# Patient Record
Sex: Female | Born: 1965 | Race: White | Hispanic: No | Marital: Married | Smoking: Never smoker
Health system: Southern US, Community
[De-identification: ages and names within clinical notes are randomized; demographics above are authoritative.]

## PROBLEM LIST (undated history)

## (undated) DIAGNOSIS — T4145XA Adverse effect of unspecified anesthetic, initial encounter: Secondary | ICD-10-CM

## (undated) DIAGNOSIS — M25569 Pain in unspecified knee: Secondary | ICD-10-CM

## (undated) DIAGNOSIS — R51 Headache: Secondary | ICD-10-CM

## (undated) DIAGNOSIS — Z8489 Family history of other specified conditions: Secondary | ICD-10-CM

## (undated) DIAGNOSIS — R519 Headache, unspecified: Secondary | ICD-10-CM

## (undated) DIAGNOSIS — I10 Essential (primary) hypertension: Secondary | ICD-10-CM

## (undated) DIAGNOSIS — Z9889 Other specified postprocedural states: Secondary | ICD-10-CM

## (undated) DIAGNOSIS — K219 Gastro-esophageal reflux disease without esophagitis: Secondary | ICD-10-CM

## (undated) DIAGNOSIS — R112 Nausea with vomiting, unspecified: Secondary | ICD-10-CM

## (undated) DIAGNOSIS — T8859XA Other complications of anesthesia, initial encounter: Secondary | ICD-10-CM

## (undated) HISTORY — PX: CHOLECYSTECTOMY: SHX55

## (undated) HISTORY — PX: MENISCUS REPAIR: SHX5179

## (undated) HISTORY — PX: APPENDECTOMY: SHX54

## (undated) HISTORY — PX: TENDON REPAIR: SHX5111

## (undated) HISTORY — PX: ABDOMINAL HYSTERECTOMY: SHX81

---

## 2011-01-19 ENCOUNTER — Ambulatory Visit: Payer: Self-pay | Admitting: Internal Medicine

## 2011-06-12 DIAGNOSIS — I10 Essential (primary) hypertension: Secondary | ICD-10-CM | POA: Insufficient documentation

## 2012-12-05 ENCOUNTER — Ambulatory Visit: Payer: Self-pay | Admitting: Physician Assistant

## 2013-04-07 ENCOUNTER — Ambulatory Visit: Payer: Self-pay | Admitting: Family Medicine

## 2013-12-01 ENCOUNTER — Ambulatory Visit: Payer: Self-pay | Admitting: Emergency Medicine

## 2015-02-05 ENCOUNTER — Ambulatory Visit: Payer: Self-pay | Admitting: Emergency Medicine

## 2015-02-25 ENCOUNTER — Ambulatory Visit
Admit: 2015-02-25 | Disposition: A | Discharge status: Home / Self Care | Payer: Self-pay | Attending: Family Medicine | Admitting: Family Medicine

## 2015-03-13 ENCOUNTER — Ambulatory Visit
Admit: 2015-03-13 | Disposition: A | Discharge status: Home / Self Care | Payer: Self-pay | Attending: Family Medicine | Admitting: Family Medicine

## 2015-04-25 DIAGNOSIS — K219 Gastro-esophageal reflux disease without esophagitis: Secondary | ICD-10-CM | POA: Insufficient documentation

## 2015-05-02 ENCOUNTER — Telehealth: Payer: Self-pay | Admitting: Gastroenterology

## 2015-05-02 NOTE — Telephone Encounter (Signed)
Colonoscopy triage °

## 2015-05-08 ENCOUNTER — Other Ambulatory Visit: Payer: Self-pay

## 2015-05-08 DIAGNOSIS — N200 Calculus of kidney: Secondary | ICD-10-CM | POA: Insufficient documentation

## 2015-05-08 NOTE — Telephone Encounter (Signed)
Pt is returning your phone call regarding colonoscopy triage

## 2015-05-08 NOTE — Telephone Encounter (Signed)
LVM for pt to return my call.

## 2015-05-08 NOTE — Telephone Encounter (Signed)
Gastroenterology Pre-Procedure Review  Request Date: 05-18-15 Requesting Physician: Dr. Rolm Gala  PATIENT REVIEW QUESTIONS: The patient responded to the following health history questions as indicated:    1. Are you having any GI issues? no 2. Do you have a personal history of Polyps? no 3. Do you have a family history of Colon Cancer or Polyps? no 4. Diabetes Mellitus? no 5. Joint replacements in the past 12 months?no 6. Major health problems in the past 3 months?no 7. Any artificial heart valves, MVP, or defibrillator?no    MEDICATIONS & ALLERGIES:    Patient reports the following regarding taking any anticoagulation/antiplatelet therapy:   Plavix, Coumadin, Eliquis, Xarelto, Lovenox, Pradaxa, Brilinta, or Effient? no Aspirin? yes (81mg )  Patient confirms/reports the following medications:  Current Outpatient Prescriptions  Medication Sig Dispense Refill  . ALLEGRA-D ALLERGY & CONGESTION 180-240 MG per 24 hr tablet TAKE 1 TAB BY MOUTH ONCE A DAY  0  . aspirin EC 81 MG tablet Take by mouth.    . butalbital-acetaminophen-caffeine (FIORICET, ESGIC) 50-325-40 MG per tablet TAKE 1 TABLET BY MOUTH EVERY 6 HOURS AS NEEDED FOR MIGRAINE  99  . FLUoxetine (PROZAC) 40 MG capsule Take 40 mg by mouth daily.  0  . fluticasone (FLONASE) 50 MCG/ACT nasal spray Place 2 sprays into both nostrils daily.  2  . lisinopril (PRINIVIL,ZESTRIL) 10 MG tablet TAKE 1 TABLET (10 MG TOTAL) BY MOUTH ONCE DAILY.  11  . LORazepam (ATIVAN) 0.5 MG tablet TAKE 1/2-1 TABLET BY MOUTH THREE TIMES A DAY AS NEEDED  0  . meloxicam (MOBIC) 15 MG tablet TK 1 T PO QD.  DO NOT TAKE WITH MOTRIN OR ALEVE  0  . omeprazole (PRILOSEC) 20 MG capsule Take 20 mg by mouth daily.  0  . oxyCODONE-acetaminophen (PERCOCET/ROXICET) 5-325 MG per tablet TK 1 T PO Q 8 H PRN  0  . topiramate (TOPAMAX) 100 MG tablet Take 100 mg by mouth daily.  0  . traMADol (ULTRAM) 50 MG tablet Take 50 mg by mouth every 6 (six) hours as needed. for pain  0    No current facility-administered medications for this visit.    Patient confirms/reports the following allergies:  Allergies  Allergen Reactions  . Other Rash and Anaphylaxis    ARUGULA Uncoded Allergy. Allergen: Shellfish Arugula: Swelling of the throat    No orders of the defined types were placed in this encounter.    AUTHORIZATION INFORMATION Primary Insurance: 1D#: Group #:  Secondary Insurance: 1D#: Group #:  SCHEDULE INFORMATION: Date: 05-18-15 Time: Location: [facility]

## 2015-05-09 ENCOUNTER — Other Ambulatory Visit: Payer: Self-pay | Admitting: Family Medicine

## 2015-05-09 DIAGNOSIS — Z1231 Encounter for screening mammogram for malignant neoplasm of breast: Secondary | ICD-10-CM

## 2015-05-14 ENCOUNTER — Encounter: Payer: Self-pay | Admitting: *Deleted

## 2015-05-15 ENCOUNTER — Ambulatory Visit
Admission: RE | Admit: 2015-05-15 | Discharge: 2015-05-15 | Disposition: A | Discharge status: Home / Self Care | Payer: BLUE CROSS/BLUE SHIELD | Source: Ambulatory Visit | Attending: Family Medicine | Admitting: Family Medicine

## 2015-05-15 ENCOUNTER — Other Ambulatory Visit: Payer: Self-pay

## 2015-05-15 DIAGNOSIS — Z1211 Encounter for screening for malignant neoplasm of colon: Secondary | ICD-10-CM

## 2015-05-15 DIAGNOSIS — Z1231 Encounter for screening mammogram for malignant neoplasm of breast: Secondary | ICD-10-CM

## 2015-05-15 MED ORDER — NA SULFATE-K SULFATE-MG SULF 17.5-3.13-1.6 GM/177ML PO SOLN: 1.0000 | ORAL | Status: DC

## 2015-05-17 NOTE — Discharge Instructions (Signed)

## 2015-05-18 ENCOUNTER — Ambulatory Visit: Payer: BLUE CROSS/BLUE SHIELD | Admitting: Anesthesiology

## 2015-05-18 ENCOUNTER — Ambulatory Visit
Admission: RE | Admit: 2015-05-18 | Discharge: 2015-05-18 | Disposition: A | Discharge status: Home / Self Care | Payer: BLUE CROSS/BLUE SHIELD | Source: Ambulatory Visit | Attending: Gastroenterology | Admitting: Gastroenterology

## 2015-05-18 ENCOUNTER — Encounter
Admission: RE | Disposition: A | Discharge status: Home / Self Care | Payer: Self-pay | Source: Ambulatory Visit | Attending: Gastroenterology

## 2015-05-18 DIAGNOSIS — Z1211 Encounter for screening for malignant neoplasm of colon: Secondary | ICD-10-CM | POA: Insufficient documentation

## 2015-05-18 DIAGNOSIS — K640 First degree hemorrhoids: Secondary | ICD-10-CM | POA: Diagnosis not present

## 2015-05-18 DIAGNOSIS — K5730 Diverticulosis of large intestine without perforation or abscess without bleeding: Secondary | ICD-10-CM | POA: Insufficient documentation

## 2015-05-18 DIAGNOSIS — K219 Gastro-esophageal reflux disease without esophagitis: Secondary | ICD-10-CM | POA: Insufficient documentation

## 2015-05-18 DIAGNOSIS — Z90710 Acquired absence of both cervix and uterus: Secondary | ICD-10-CM | POA: Diagnosis not present

## 2015-05-18 DIAGNOSIS — Z888 Allergy status to other drugs, medicaments and biological substances status: Secondary | ICD-10-CM | POA: Insufficient documentation

## 2015-05-18 DIAGNOSIS — Z9049 Acquired absence of other specified parts of digestive tract: Secondary | ICD-10-CM | POA: Diagnosis not present

## 2015-05-18 DIAGNOSIS — I10 Essential (primary) hypertension: Secondary | ICD-10-CM | POA: Diagnosis not present

## 2015-05-18 DIAGNOSIS — Z79899 Other long term (current) drug therapy: Secondary | ICD-10-CM | POA: Diagnosis not present

## 2015-05-18 HISTORY — DX: Other specified postprocedural states: Z98.890

## 2015-05-18 HISTORY — DX: Headache: R51

## 2015-05-18 HISTORY — DX: Other complications of anesthesia, initial encounter: T88.59XA

## 2015-05-18 HISTORY — DX: Family history of other specified conditions: Z84.89

## 2015-05-18 HISTORY — DX: Nausea with vomiting, unspecified: R11.2

## 2015-05-18 HISTORY — DX: Pain in unspecified knee: M25.569

## 2015-05-18 HISTORY — DX: Essential (primary) hypertension: I10

## 2015-05-18 HISTORY — DX: Headache, unspecified: R51.9

## 2015-05-18 HISTORY — DX: Adverse effect of unspecified anesthetic, initial encounter: T41.45XA

## 2015-05-18 HISTORY — PX: COLONOSCOPY WITH PROPOFOL: SHX5780

## 2015-05-18 HISTORY — DX: Gastro-esophageal reflux disease without esophagitis: K21.9

## 2015-05-18 SURGERY — COLONOSCOPY WITH PROPOFOL
Anesthesia: Monitor Anesthesia Care | Wound class: Contaminated

## 2015-05-18 MED ORDER — ACETAMINOPHEN 325 MG PO TABS: 325.0000 mg | ORAL_TABLET | ORAL | Status: DC | PRN

## 2015-05-18 MED ORDER — STERILE WATER FOR IRRIGATION IR SOLN
Status: DC | PRN
  Administered 2015-05-18: 13:00:00

## 2015-05-18 MED ORDER — LIDOCAINE HCL (CARDIAC) 20 MG/ML IV SOLN
INTRAVENOUS | Status: DC | PRN
  Administered 2015-05-18: 40 mg via INTRAVENOUS

## 2015-05-18 MED ORDER — ONDANSETRON HCL 4 MG/2ML IJ SOLN
INTRAMUSCULAR | Status: DC | PRN
Start: 2015-05-18 — End: 2015-05-18
  Administered 2015-05-18: 4 mg via INTRAVENOUS

## 2015-05-18 MED ORDER — PROPOFOL 10 MG/ML IV BOLUS
INTRAVENOUS | Status: DC | PRN
  Administered 2015-05-18: 100 mg via INTRAVENOUS
  Administered 2015-05-18 (×3): 50 mg via INTRAVENOUS
  Administered 2015-05-18: 100 mg via INTRAVENOUS

## 2015-05-18 MED ORDER — ACETAMINOPHEN 160 MG/5ML PO SOLN: 325.0000 mg | ORAL | Status: DC | PRN

## 2015-05-18 MED ORDER — SODIUM CHLORIDE 0.9 % IV SOLN: INTRAVENOUS | Status: DC

## 2015-05-18 MED ORDER — LACTATED RINGERS IV SOLN
INTRAVENOUS | Status: DC
  Administered 2015-05-18: 13:00:00 via INTRAVENOUS

## 2015-05-18 SURGICAL SUPPLY — 28 items

## 2015-05-18 NOTE — H&P (Signed)
[facility]  [address], [place], [place] [address] Phone: [phone] Fax : [phone]  Primary Care Physician:  Hortencia Pilar, MD Primary Gastroenterologist:  Dr. Allen Norris  Pre-Procedure History & Physical: HPI:  Brianna Mckenzie is a 49 y.o. female is here for a screening colonoscopy.   Past Medical History  Diagnosis Date  . Complication of anesthesia     causes migraines and PONV  . PONV (postoperative nausea and vomiting)   . Family history of adverse reaction to anesthesia     causes migraines and PONV  . Hypertension   . Headache     migraines  . GERD (gastroesophageal reflux disease)   . Knee pain     left    Past Surgical History  Procedure Laterality Date  . Cholecystectomy    . Appendectomy    . Abdominal hysterectomy    . Tendon repair Left     thumb  . Meniscus repair Right     Prior to Admission medications   Medication Sig Start Date End Date Taking? Authorizing Provider  aspirin EC 81 MG tablet Take by mouth.   Yes Historical Provider, MD  B Complex Vitamins (VITAMIN B COMPLEX PO) Take by mouth.   Yes Historical Provider, MD  EPINEPHrine (EPIPEN 2-PAK) 0.3 mg/0.3 mL IJ SOAJ injection Inject into the muscle once.   Yes Historical Provider, MD  FLUoxetine (PROZAC) 40 MG capsule Take 40 mg by mouth daily. PM 03/13/15  Yes Historical Provider, MD  ibuprofen (ADVIL,MOTRIN) 800 MG tablet Take 800 mg by mouth every 8 (eight) hours as needed.   Yes Historical Provider, MD  lisinopril (PRINIVIL,ZESTRIL) 10 MG tablet TAKE 1 TABLET (10 MG TOTAL) BY MOUTH ONCE DAILY. 04/26/15  Yes Historical Provider, MD  MELATONIN PO Take by mouth. PM   Yes Historical Provider, MD  Na Sulfate-K Sulfate-Mg Sulf (SUPREP BOWEL PREP) SOLN Take 1 kit by mouth as directed. 05/15/15  Yes Lucilla Lame, MD  omeprazole (PRILOSEC) 20 MG capsule Take 20 mg by mouth daily. Taking every 3rd evening 03/13/15  Yes Historical Provider, MD  ondansetron (ZOFRAN) 4 MG tablet Take  4 mg by mouth every 8 (eight) hours as needed for nausea or vomiting.   Yes Historical Provider, MD  ranitidine (ZANTAC) 150 MG tablet Take 150 mg by mouth 2 (two) times daily. On days not taking omeprazole   Yes Historical Provider, MD  topiramate (TOPAMAX) 100 MG tablet Take 100 mg by mouth daily. PM 03/13/15  Yes Historical Provider, MD  ALLEGRA-D ALLERGY & CONGESTION 180-240 MG per 24 hr tablet TAKE 1 TAB BY MOUTH ONCE A DAY 02/25/15   Historical Provider, MD  butalbital-acetaminophen-caffeine (FIORICET, ESGIC) 50-325-40 MG per tablet TAKE 1 TABLET BY MOUTH EVERY 6 HOURS AS NEEDED FOR MIGRAINE 04/25/15   Historical Provider, MD  fluticasone (FLONASE) 50 MCG/ACT nasal spray Place 2 sprays into both nostrils daily. 02/05/15   Historical Provider, MD  LORazepam (ATIVAN) 0.5 MG tablet TAKE 1/2-1 TABLET BY MOUTH THREE TIMES A DAY AS NEEDED 04/25/15   Historical Provider, MD  meloxicam (MOBIC) 15 MG tablet TK 1 T PO QD.  DO NOT TAKE WITH MOTRIN OR ALEVE 03/13/15   Historical Provider, MD  oxyCODONE-acetaminophen (PERCOCET/ROXICET) 5-325 MG per tablet TK 1 T PO Q 8 H PRN 03/13/15   Historical Provider, MD  traMADol (ULTRAM) 50 MG tablet Take 50 mg by mouth every 6 (six) hours as needed. for pain 03/16/15   Historical Provider, MD  Allergies as of 05/08/2015 - Review Complete 05/08/2015  Allergen Reaction Noted  . Other Rash and Anaphylaxis 05/08/2015    History reviewed. No pertinent family history.  History   Social History  . Marital Status: Married    Spouse Name: N/A  . Number of Children: N/A  . Years of Education: N/A   Occupational History  . Not on file.   Social History Main Topics  . Smoking status: Never Smoker   . Smokeless tobacco: Not on file  . Alcohol Use: No  . Drug Use: Not on file  . Sexual Activity: Not on file   Other Topics Concern  . Not on file   Social History Narrative    Review of Systems: See HPI, otherwise negative ROS  Physical Exam: BP 127/93 mmHg   Pulse 110  Resp 18  Ht 5' (1.524 m)  Wt 211 lb (95.709 kg)  BMI 41.21 kg/m2  SpO2 95% General:   Alert,  pleasant and cooperative in NAD Head:  Normocephalic and atraumatic. Neck:  Supple; no masses or thyromegaly. Lungs:  Clear throughout to auscultation.    Heart:  Regular rate and rhythm. Abdomen:  Soft, nontender and nondistended. Normal bowel sounds, without guarding, and without rebound.   Neurologic:  Alert and  oriented x4;  grossly normal neurologically.  Impression/Plan: Brianna Mckenzie is now here to undergo a screening colonoscopy.  Risks, benefits, and alternatives regarding colonoscopy have been reviewed with the patient.  Questions have been answered.  All parties agreeable.

## 2015-05-18 NOTE — Anesthesia Procedure Notes (Addendum)
Procedure Name: MAC Performed by: Lilyann Gravelle Pre-anesthesia Checklist: Patient identified, Emergency Drugs available, Suction available, Timeout performed and Patient being monitored Patient Re-evaluated:Patient Re-evaluated prior to inductionOxygen Delivery Method: Nasal cannula Placement Confirmation: positive ETCO2     

## 2015-05-18 NOTE — Anesthesia Preprocedure Evaluation (Signed)
Anesthesia Evaluation  Patient identified by MRN, date of birth, ID band  Reviewed: Allergy & Precautions, H&P , NPO status , Patient's Chart, lab work & pertinent test results  Airway Mallampati: II  TM Distance: >3 FB Neck ROM: full    Dental no notable dental hx.    Pulmonary    Pulmonary exam normal       Cardiovascular hypertension, Rhythm:regular Rate:Normal     Neuro/Psych  Headaches,    GI/Hepatic   Endo/Other    Renal/GU      Musculoskeletal   Abdominal   Peds  Hematology   Anesthesia Other Findings   Reproductive/Obstetrics                             Anesthesia Physical Anesthesia Plan  ASA: II  Anesthesia Plan: MAC   Post-op Pain Management:    Induction:   Airway Management Planned:   Additional Equipment:   Intra-op Plan:   Post-operative Plan:   Informed Consent: I have reviewed the patients History and Physical, chart, labs and discussed the procedure including the risks, benefits and alternatives for the proposed anesthesia with the patient or authorized representative who has indicated his/her understanding and acceptance.     Plan Discussed with: CRNA  Anesthesia Plan Comments:         Anesthesia Quick Evaluation

## 2015-05-18 NOTE — Transfer of Care (Signed)
Immediate Anesthesia Transfer of Care Note  Patient: Brianna Mckenzie  Procedure(s) Performed: Procedure(s): COLONOSCOPY WITH PROPOFOL (N/A)  Patient Location: PACU  Anesthesia Type: MAC  Level of Consciousness: awake, alert  and patient cooperative  Airway and Oxygen Therapy: Patient Spontanous Breathing and Patient connected to supplemental oxygen  Post-op Assessment: Post-op Vital signs reviewed, Patient's Cardiovascular Status Stable, Respiratory Function Stable, Patent Airway and No signs of Nausea or vomiting  Post-op Vital Signs: Reviewed and stable  Complications: No apparent anesthesia complications

## 2015-05-18 NOTE — Op Note (Signed)
[facility] Gastroenterology Patient Name: Brianna Mckenzie Procedure Date: 05/18/2015 12:55 PM MRN: [id number] Account #: [id number] Date of Birth: 02/14/1966 Admit Type: Outpatient Age: 49 [facility] Gender: Female Note Status: Finalized Procedure:         Colonoscopy Indications:       Screening for colorectal malignant neoplasm Providers:         Midge Miniumarren Jailine Lieder, MD Referring MD:      Letitia CaulHeidi M. Grandis, MD (Referring MD) Medicines:         Propofol per Anesthesia Complications:     No immediate complications. Procedure:         Pre-Anesthesia Assessment:                    - Prior to the procedure, a History and Physical was                     performed, and patient medications and allergies were                     reviewed. The patient's tolerance of previous anesthesia                     was also reviewed. The risks and benefits of the procedure                     and the sedation options and risks were discussed with the                     patient. All questions were answered, and informed consent                     was obtained. Prior Anticoagulants: The patient has taken                     no previous anticoagulant or antiplatelet agents. ASA                     Grade Assessment: II - A patient with mild systemic                     disease. After reviewing the risks and benefits, the                     patient was deemed in satisfactory condition to undergo                     the procedure.                    After obtaining informed consent, the colonoscope was                     passed under direct vision. Throughout the procedure, the                     patient's blood pressure, pulse, and oxygen saturations                     were monitored continuously. The Olympus CF H180AL                     colonoscope (S#: [id number]) was introduced through the anus  and advanced to the the cecum, identified by appendiceal                 orifice and ileocecal valve. The colonoscopy was performed                     without difficulty. The patient tolerated the procedure                     well. The quality of the bowel preparation was excellent. Findings:      The perianal and digital rectal examinations were normal.      A few small-mouthed diverticula were found in the sigmoid colon.      Non-bleeding internal hemorrhoids were found during retroflexion. The       hemorrhoids were Grade I (internal hemorrhoids that do not prolapse). Impression:        - Diverticulosis in the sigmoid colon.                    - Non-bleeding internal hemorrhoids.                    - No specimens collected. Recommendation:    - Repeat colonoscopy in 10 years for screening unless any                     change in family history or lower GI problems. Procedure Code(s): --- Professional ---                    [phone], Colonoscopy, flexible; diagnostic, including                     collection of specimen(s) by brushing or washing, when                     performed (separate procedure) Diagnosis Code(s): --- Professional ---                    Z12.11, Encounter for screening for malignant neoplasm of                     colon                    K64.0, First degree hemorrhoids CPT copyright 2014 American Medical Association. All rights reserved. The codes documented in this report are preliminary and upon coder review may  be revised to meet current compliance requirements. Midge Minium, MD 05/18/2015 1:07:17 PM This report has been signed electronically. Number of Addenda: 0 Note Initiated On: 05/18/2015 12:55 PM Scope Withdrawal Time: 0 hours 6 minutes 5 seconds  Total Procedure Duration: 0 hours 8 minutes 42 seconds       [facility]

## 2015-05-18 NOTE — Anesthesia Postprocedure Evaluation (Signed)
  Anesthesia Post-op Note  Patient: Brianna Mckenzie  Procedure(s) Performed: Procedure(s): COLONOSCOPY WITH PROPOFOL (N/A)  Anesthesia type:MAC  Patient location: PACU  Post pain: Pain level controlled  Post assessment: Post-op Vital signs reviewed, Patient's Cardiovascular Status Stable, Respiratory Function Stable, Patent Airway and No signs of Nausea or vomiting  Post vital signs: Reviewed and stable  Last Vitals:  Filed Vitals:   05/18/15 1318  BP: 84/55  Pulse: 92  Temp:   Resp: 26    Level of consciousness: awake, alert  and patient cooperative  Complications: No apparent anesthesia complications

## 2015-05-22 ENCOUNTER — Encounter: Payer: Self-pay | Admitting: Gastroenterology

## 2015-05-23 DIAGNOSIS — K640 First degree hemorrhoids: Secondary | ICD-10-CM | POA: Insufficient documentation

## 2015-05-23 DIAGNOSIS — Z1211 Encounter for screening for malignant neoplasm of colon: Secondary | ICD-10-CM | POA: Insufficient documentation

## 2016-01-23 ENCOUNTER — Other Ambulatory Visit: Payer: Self-pay | Admitting: Family Medicine

## 2016-01-23 ENCOUNTER — Ambulatory Visit
Admission: RE | Admit: 2016-01-23 | Discharge: 2016-01-23 | Disposition: A | Discharge status: Home / Self Care | Payer: BLUE CROSS/BLUE SHIELD | Source: Ambulatory Visit | Attending: Family Medicine | Admitting: Family Medicine

## 2016-01-23 DIAGNOSIS — R109 Unspecified abdominal pain: Secondary | ICD-10-CM | POA: Diagnosis present

## 2016-01-23 DIAGNOSIS — N200 Calculus of kidney: Secondary | ICD-10-CM | POA: Diagnosis not present

## 2016-01-23 DIAGNOSIS — R319 Hematuria, unspecified: Secondary | ICD-10-CM

## 2016-01-23 MED ORDER — IOHEXOL 350 MG/ML SOLN
100.0000 mL | Freq: Once | INTRAVENOUS | Status: AC | PRN
  Administered 2016-01-23: 100 mL via INTRAVENOUS

## 2016-05-24 ENCOUNTER — Ambulatory Visit (INDEPENDENT_AMBULATORY_CARE_PROVIDER_SITE_OTHER): Payer: BLUE CROSS/BLUE SHIELD | Admitting: Internal Medicine

## 2016-05-24 ENCOUNTER — Ambulatory Visit (INDEPENDENT_AMBULATORY_CARE_PROVIDER_SITE_OTHER): Payer: BLUE CROSS/BLUE SHIELD

## 2016-05-24 VITALS — BP 130/90 | HR 95 | Temp 98.4°F | Resp 18 | Ht 60.0 in | Wt 212.0 lb

## 2016-05-24 DIAGNOSIS — S0033XA Contusion of nose, initial encounter: Secondary | ICD-10-CM

## 2016-05-24 NOTE — Progress Notes (Signed)
Subjective:  By signing my name below, I, Brianna Mckenzie, attest that this documentation has been prepared under the direction and in the presence of Tami Lin, MD. Electronically Signed: Moises Mckenzie, Leando. 05/24/2016 , 11:51 AM .  Patient was seen in Room 14 .   Patient ID: Brianna Mckenzie, female    DOB: 01/21/1966, 50 y.o.   MRN: [id number] No chief complaint on file.  HPI Brianna Mckenzie is a 50 y.o. female who presents to [facility] complaining of facial injury due to a fall which occurred early this morning. Her niece is visiting her; they decided to enjoy a few glasses of wine and watched "The Conjuring" (a horror movie). Her niece did not want to sleep by herself in the upstairs guest room, so they decided to use a blow-up mattress placed in the master bedroom. At around 4:00AM, the patient woke up to go to the bathroom. She tripped over the blow-up mattress and/or her Restaurant manager, fast food. She fell and hit her face onto the dresser, also reporting a lot of Mckenzie from her nose. She may have had syncope due to shock. She notes upper lip feeling swollen, and facial ache in her cheeks with a headache. She denies neck pain or neck stiffness.   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   Patient Active Problem List   Diagnosis Date Noted  . Special screening for malignant neoplasms, colon   . First degree hemorrhoids   . Calculus of kidney 05/08/2015  . Gastro-esophageal reflux disease without esophagitis 04/25/2015  . Essential (primary) hypertension 06/12/2011    Current outpatient prescriptions:  .  ALLEGRA-D ALLERGY & CONGESTION 180-240 MG per 24 hr tablet, TAKE 1 TAB BY MOUTH ONCE A DAY, Disp: , Rfl: 0 .  aspirin EC 81 MG tablet, Take by mouth., Disp: , Rfl:  .  B Complex Vitamins (VITAMIN B COMPLEX PO), Take by mouth., Disp: , Rfl:  .  butalbital-acetaminophen-caffeine (FIORICET, ESGIC) 50-325-40 MG per tablet, TAKE 1 TABLET BY MOUTH EVERY 6 HOURS AS NEEDED FOR MIGRAINE, Disp: ,  Rfl: 99 .  EPINEPHrine (EPIPEN 2-PAK) 0.3 mg/0.3 mL IJ SOAJ injection, Inject into the muscle once., Disp: , Rfl:  .  FLUoxetine (PROZAC) 40 MG capsule, Take 40 mg by mouth daily. PM, Disp: , Rfl: 0 .  lisinopril (PRINIVIL,ZESTRIL) 10 MG tablet, TAKE 1 TABLET (10 MG TOTAL) BY MOUTH ONCE DAILY., Disp: , Rfl: 11 .  MELATONIN PO, Take by mouth. PM, Disp: , Rfl:  .  Na Sulfate-K Sulfate-Mg Sulf (SUPREP BOWEL PREP) SOLN, Take 1 kit by mouth as directed., Disp: 1 Bottle, Rfl: 0 .  omeprazole (PRILOSEC) 20 MG capsule, Take 20 mg by mouth daily. Taking every 3rd evening, Disp: , Rfl: 0 .  ondansetron (ZOFRAN) 4 MG tablet, Take 4 mg by mouth every 8 (eight) hours as needed for nausea or vomiting., Disp: , Rfl:  .  ranitidine (ZANTAC) 150 MG tablet, Take 150 mg by mouth 2 (two) times daily. On days not taking omeprazole, Disp: , Rfl:  .  topiramate (TOPAMAX) 100 MG tablet, Take 100 mg by mouth daily. PM, Disp: , Rfl: 0 .  LORazepam (ATIVAN) 0.5 MG tablet, Reported on 05/24/2016, Disp: , Rfl: 0 .  meloxicam (MOBIC) 15 MG tablet, Reported on 05/24/2016, Disp: , Rfl: 0 .  oxyCODONE-acetaminophen (PERCOCET/ROXICET) 5-325 MG per tablet, Reported on 05/24/2016, Disp: , Rfl: 0 .  traMADol (ULTRAM) 50 MG tablet, Take 50 mg by mouth every 6 (six)  hours as needed. Reported on 05/24/2016, Disp: , Rfl: 0 Allergies  Allergen Reactions  . Other Rash and Anaphylaxis    ARUGULA Uncoded Allergy. Allergen: Shellfish Arugula: Swelling of the throat  . Chloraprep One Step [Chlorhexidine Gluconate] Rash  . Shrimp [Shellfish Allergy] Rash   Review of Systems  Constitutional: Negative for fever, chills and fatigue.  HENT: Positive for facial swelling and nosebleeds.   Respiratory: Negative for cough, shortness of breath and wheezing.   Gastrointestinal: Negative for nausea, vomiting and diarrhea.  Neurological: Positive for headaches. Negative for dizziness.  HA is periorbital No vision changes    Objective:   Physical Exam   Constitutional: She is oriented to person, place, and time. She appears well-developed and well-nourished. No distress.  HENT:  Head: Normocephalic and atraumatic.  Mouth/Throat: Oropharynx is clear and moist.  Nose: Mckenzie in both nares, nose itself is swollen with slight deviation to the left, no obvious septal hematoma, Mouth: upper lip is swollen with area of ecchymosis 2cm on the buccal mucosa, teeth are intact. No oral laceration Jaw opens fully without pain Tender both maxillary areas but no orbital rim tenderness No longer bleeding  Eyes: Conjunctivae and EOM are normal. Pupils are equal, round, and reactive to light.  Neck: Normal range of motion. Neck supple.  Cardiovascular: Normal rate.   Pulmonary/Chest: Effort normal. No respiratory distress.  Musculoskeletal: Normal range of motion.  Neurological: She is alert and oriented to person, place, and time. No cranial nerve deficit. Coordination normal.  Gait wnl  Skin: Skin is warm and dry.  Psychiatric: She has a normal mood and affect. Her behavior is normal.  Nursing note and vitals reviewed.   BP 130/90 mmHg  Pulse 95  Temp(Src) 98.4 F (36.9 C) (Oral)  Resp 18  Ht 5' (1.524 m)  Wt 212 lb (96.163 kg)  BMI 41.40 kg/m2  SpO2 98%   Dg Nasal Bones  05/24/2016  CLINICAL DATA:  Fall with nasal injury 1 night prior. EXAM: NASAL BONES - 3+ VIEW COMPARISON:  None. FINDINGS: There is no evidence of fracture or other bone abnormality. Nasal septum appears midline and intact. IMPRESSION: Negative. Electronically Signed   By: Ilona Sorrel M.D.   On: 05/24/2016 12:11  no sinus opacification    Assessment & Plan:  I have completed the patient encounter in its entirety as documented by the scribe, with editing by me where necessary. Robert P. Laney Pastor, M.D.  Contusion, nose/upper lip area, initial encounter -  Ice 20 q2h today Tylenol qid If pain increases will get CT to r/o occult fx or septal hematoma Might have concussion  sxt and these were discussed with re to F/U

## 2016-05-24 NOTE — Patient Instructions (Signed)
     IF you received an x-ray today, you will receive an invoice from [facility]. Please contact [facility] at [phone] with questions or concerns regarding your invoice.   IF you received labwork today, you will receive an invoice from [facility]. Please contact [facility] at [phone] with questions or concerns regarding your invoice.   Our billing staff will not be able to assist you with questions regarding bills from these companies.  You will be contacted with the lab results as soon as they are available. The fastest way to get your results is to activate your My Chart account. Instructions are located on the last page of this paperwork. If you have not heard from us regarding the results in 2 weeks, please contact this office.      

## 2017-09-21 ENCOUNTER — Other Ambulatory Visit: Payer: Self-pay | Admitting: Family Medicine

## 2017-09-21 DIAGNOSIS — Z1231 Encounter for screening mammogram for malignant neoplasm of breast: Secondary | ICD-10-CM

## 2017-09-30 ENCOUNTER — Ambulatory Visit: Payer: BLUE CROSS/BLUE SHIELD

## 2017-10-15 ENCOUNTER — Ambulatory Visit
Admission: RE | Admit: 2017-10-15 | Discharge: 2017-10-15 | Disposition: A | Discharge status: Home / Self Care | Payer: BLUE CROSS/BLUE SHIELD | Source: Ambulatory Visit | Attending: Family Medicine | Admitting: Family Medicine

## 2017-10-15 DIAGNOSIS — Z1231 Encounter for screening mammogram for malignant neoplasm of breast: Secondary | ICD-10-CM | POA: Diagnosis not present

## 2018-12-13 ENCOUNTER — Other Ambulatory Visit: Payer: Self-pay | Admitting: Family Medicine

## 2018-12-13 DIAGNOSIS — Z1231 Encounter for screening mammogram for malignant neoplasm of breast: Secondary | ICD-10-CM

## 2018-12-28 ENCOUNTER — Encounter (INDEPENDENT_AMBULATORY_CARE_PROVIDER_SITE_OTHER): Payer: Self-pay

## 2018-12-28 ENCOUNTER — Ambulatory Visit
Admission: RE | Admit: 2018-12-28 | Discharge: 2018-12-28 | Disposition: A | Discharge status: Home / Self Care | Payer: BLUE CROSS/BLUE SHIELD | Source: Ambulatory Visit | Attending: Family Medicine | Admitting: Family Medicine

## 2018-12-28 DIAGNOSIS — Z1231 Encounter for screening mammogram for malignant neoplasm of breast: Secondary | ICD-10-CM | POA: Diagnosis not present

## 2020-01-03 ENCOUNTER — Other Ambulatory Visit: Payer: Self-pay | Admitting: Family Medicine

## 2020-01-03 DIAGNOSIS — Z1231 Encounter for screening mammogram for malignant neoplasm of breast: Secondary | ICD-10-CM

## 2020-01-11 ENCOUNTER — Ambulatory Visit: Payer: BLUE CROSS/BLUE SHIELD

## 2020-03-12 ENCOUNTER — Encounter (INDEPENDENT_AMBULATORY_CARE_PROVIDER_SITE_OTHER): Payer: Self-pay

## 2020-03-12 ENCOUNTER — Other Ambulatory Visit: Payer: Self-pay

## 2020-03-12 ENCOUNTER — Ambulatory Visit
Admission: RE | Admit: 2020-03-12 | Discharge: 2020-03-12 | Disposition: A | Discharge status: Home / Self Care | Payer: BC Managed Care – PPO | Source: Ambulatory Visit | Attending: Family Medicine | Admitting: Family Medicine

## 2020-03-12 DIAGNOSIS — Z1231 Encounter for screening mammogram for malignant neoplasm of breast: Secondary | ICD-10-CM | POA: Diagnosis present

## 2021-02-12 ENCOUNTER — Ambulatory Visit: Payer: BLUE CROSS/BLUE SHIELD

## 2021-02-12 NOTE — Progress Notes (Unsigned)
Pt did not show for scheduled appointment. Office staff will reach out to reschedule.  

## 2021-02-13 ENCOUNTER — Ambulatory Visit (INDEPENDENT_AMBULATORY_CARE_PROVIDER_SITE_OTHER): Payer: BC Managed Care – PPO | Admitting: Internal Medicine

## 2021-02-13 DIAGNOSIS — I10 Essential (primary) hypertension: Secondary | ICD-10-CM

## 2021-02-13 DIAGNOSIS — G4733 Obstructive sleep apnea (adult) (pediatric): Secondary | ICD-10-CM | POA: Diagnosis not present

## 2021-02-13 DIAGNOSIS — K219 Gastro-esophageal reflux disease without esophagitis: Secondary | ICD-10-CM | POA: Diagnosis not present

## 2021-02-13 DIAGNOSIS — F320 Major depressive disorder, single episode, mild: Secondary | ICD-10-CM | POA: Diagnosis not present

## 2021-02-13 NOTE — Progress Notes (Signed)
Sleep Medicine   Office Visit  Patient Name: Brianna Mckenzie DOB: [birth year] MRN [id number]  I connected with  Brianna Mckenzie on 02/13/21 by a video enabled telemedicine application and verified that I am speaking with the correct person using two identifiers.   I discussed the limitations of evaluation and management by telemedicine. The patient expressed understanding and agreed to proceed.   Chief Complaint: SS results   HISTORY OF PRESENT ILLNESS: Brianna Mckenzie is seen today for follow up for CPAP titration results. She is recommended to start on APAP range 10-20 cm of H2O due to worsening apnea in certain positions. She does not take naps during the week because she is busy, but would if she had time. ESS 8 out of 24. Pt does have CV risk factor of hypertension. No significant PLMs during study.  ROS  General: (-) fever, (-) chills, (-) night sweat Nose and Sinuses: (-) nasal stuffiness or itchiness, (-) postnasal drip, (-) nosebleeds, (-) sinus trouble. Mouth and Throat: (-) sore throat, (-) hoarseness. Neck: (-) swollen glands, (-) enlarged thyroid, (-) neck pain. Respiratory: - cough, - shortness of breath, - wheezing. Neurologic: - numbness, - tingling. Psychiatric: - anxiety, - depression Sleep behavior: -sleep paralysis -hypnogogic hallucinations -dream enactment      -vivid dreams -cataplexy -night terrors -sleep walking   Current Medication: Outpatient Encounter Medications as of 02/13/2021  Medication Sig Note  . ALLEGRA-D ALLERGY & CONGESTION 180-240 MG per 24 hr tablet TAKE 1 TAB BY MOUTH ONCE A DAY 05/08/2015: Received from: External Pharmacy  . aspirin EC 81 MG tablet Take by mouth. 05/08/2015: Received from: [facility]  . B Complex Vitamins (VITAMIN B COMPLEX PO) Take by mouth.   . butalbital-acetaminophen-caffeine (FIORICET, ESGIC) 50-325-40 MG per tablet TAKE 1 TABLET BY MOUTH EVERY 6 HOURS AS NEEDED FOR MIGRAINE 05/08/2015: Received from:  External Pharmacy  . Cetirizine HCl 10 MG CAPS Take by mouth.   . EPINEPHrine (EPIPEN 2-PAK) 0.3 mg/0.3 mL IJ SOAJ injection Inject into the muscle once.   Marland Kitchen FLUoxetine (PROZAC) 20 MG tablet Take by mouth.   Marland Kitchen FLUoxetine (PROZAC) 40 MG capsule Take 40 mg by mouth daily. PM 05/08/2015: Received from: External Pharmacy  . lisinopril (ZESTRIL) 40 MG tablet Take 40 mg by mouth daily.   . ondansetron (ZOFRAN) 4 MG tablet Take 4 mg by mouth every 8 (eight) hours as needed for nausea or vomiting.   . traZODone (DESYREL) 50 MG tablet Take 50 mg by mouth at bedtime.   . [DISCONTINUED] lisinopril (PRINIVIL,ZESTRIL) 10 MG tablet TAKE 1 TABLET (10 MG TOTAL) BY MOUTH ONCE DAILY. 05/08/2015: Received from: External Pharmacy  . [DISCONTINUED] LORazepam (ATIVAN) 0.5 MG tablet Reported on 05/24/2016 05/08/2015: Received from: External Pharmacy  . [DISCONTINUED] MELATONIN PO Take by mouth. PM   . [DISCONTINUED] meloxicam (MOBIC) 15 MG tablet Reported on 05/24/2016 05/08/2015: Received from: External Pharmacy  . [DISCONTINUED] Na Sulfate-K Sulfate-Mg Sulf (SUPREP BOWEL PREP) SOLN Take 1 kit by mouth as directed.   . [DISCONTINUED] omeprazole (PRILOSEC) 20 MG capsule Take 20 mg by mouth daily. Taking every 3rd evening 05/08/2015: Received from: External Pharmacy  . [DISCONTINUED] oxyCODONE-acetaminophen (PERCOCET/ROXICET) 5-325 MG per tablet Reported on 05/24/2016 05/08/2015: Received from: External Pharmacy  . [DISCONTINUED] ranitidine (ZANTAC) 150 MG tablet Take 150 mg by mouth 2 (two) times daily. On days not taking omeprazole   . [DISCONTINUED] topiramate (TOPAMAX) 100 MG tablet Take 100 mg by mouth daily. PM 05/08/2015: Received from: External Pharmacy  . [  DISCONTINUED] traMADol (ULTRAM) 50 MG tablet Take 50 mg by mouth every 6 (six) hours as needed. Reported on 05/24/2016 05/08/2015: Received from: External Pharmacy   No facility-administered encounter medications on file as of 02/13/2021.    Surgical History: Past Surgical  History:  Procedure Laterality Date  . ABDOMINAL HYSTERECTOMY    . APPENDECTOMY    . CHOLECYSTECTOMY    . COLONOSCOPY WITH PROPOFOL N/A 05/18/2015   Procedure: COLONOSCOPY WITH PROPOFOL;  Surgeon: Midge Minium, MD;  Location: [facility];  Service: Endoscopy;  Laterality: N/A;  . MENISCUS REPAIR Right   . TENDON REPAIR Left    thumb    Medical History: Past Medical History:  Diagnosis Date  . Complication of anesthesia    causes migraines and PONV  . Family history of adverse reaction to anesthesia    causes migraines and PONV  . GERD (gastroesophageal reflux disease)   . Headache    migraines  . Hypertension   . Knee pain    left  . PONV (postoperative nausea and vomiting)     Family History: Non contributory to the present illness  Social History: Social History   Socioeconomic History  . Marital status: Married    Spouse name: Not on file  . Number of children: Not on file  . Years of education: Not on file  . Highest education level: Not on file  Occupational History  . Not on file  Tobacco Use  . Smoking status: Never Smoker  . Smokeless tobacco: Never Used  Substance and Sexual Activity  . Alcohol use: No  . Drug use: Never  . Sexual activity: Not on file  Other Topics Concern  . Not on file  Social History Narrative  . Not on file   Social Determinants of Health   Financial Resource Strain: Not on file  Food Insecurity: Not on file  Transportation Needs: Not on file  Physical Activity: Not on file  Stress: Not on file  Social Connections: Not on file  Intimate Partner Violence: Not on file    Vital Signs: There were no vitals taken for this visit. wt 230 ht 5'0" BMI 44.9  Examination: General Appearance: The patient is well-developed, well-nourished, and in no distress. Neck Circumference:  Skin: Gross inspection of skin unremarkable. Head: normocephalic, no gross deformities. Eyes: no gross deformities noted. ENT: ears appear  grossly normal Neurologic: Alert and oriented. No involuntary movements.    EPWORTH SLEEPINESS SCALE:  Scale:  (0)= no chance of dozing; (1)= slight chance of dozing; (2)= moderate chance of dozing; (3)= high chance of dozing  Chance  Situtation    Sitting and reading: 2    Watching TV: 2    Sitting Inactive in public: 0    As a passenger in car: 0      Lying down to rest: 3    Sitting and talking: 0    Sitting quielty after lunch: 1    In a car, stopped in traffic: 0   TOTAL SCORE:   8 out of 24    SLEEP STUDIES:  1. HST 01/26/21 AHI 50 Spo68min 86%   LABS: No results found for this or any previous visit (from the past 2160 hour(s)).  Radiology: MM 3D SCREEN BREAST BILATERAL  Result Date: 03/12/2020 CLINICAL DATA:  Screening. EXAM: DIGITAL SCREENING BILATERAL MAMMOGRAM WITH TOMO AND CAD COMPARISON:  Previous exam(s). ACR Breast Density Category a: The breast tissue is almost entirely fatty. FINDINGS: There are no findings suspicious  for malignancy. Images were processed with CAD. IMPRESSION: No mammographic evidence of malignancy. A result letter of this screening mammogram will be mailed directly to the patient. RECOMMENDATION: Screening mammogram in one year. (Code:SM-B-01Y) BI-RADS CATEGORY  1: Negative. Electronically Signed   By: Ammie Ferrier M.D.   On: 03/12/2020 15:01    No results found.  No results found.    Assessment and Plan: Patient Active Problem List   Diagnosis Date Noted  . Special screening for malignant neoplasms, colon   . First degree hemorrhoids   . Calculus of kidney 05/08/2015  . Gastro-esophageal reflux disease without esophagitis 04/25/2015  . Essential (primary) hypertension 06/12/2011     PLAN OSA:   Patient has comorbid cardiovascular risk factors including: HTN which could be exacerbated by pathologic sleep-disordered breathing. Based on CPAP titration will be set up on nasal APAP with pressure range 10cm to 20cm of  H2O and follow up 30+ days after set up.   1. OSA (obstructive sleep apnea) Will order nasal APAP with pressure range 10cm to 20cm of H2O. Follow up 30+ days after setup  2. Essential (primary) hypertension Continue current medication and f/u with PCP.  3. Gastro-esophageal reflux disease without esophagitis No longer problematic so not taking omeprazole recently.  4. Depression, major, single episode, mild (HCC) Stable, continue prozac and f/u with PCP   General Counseling: I have discussed the findings of the evaluation and examination with Benjamine Mola.  I have also discussed any further diagnostic evaluation thatmay be needed or ordered today. Sonny verbalizes understanding of the findings of todays visit. We also reviewed her medications today and discussed drug interactions and side effects including but not limited excessive drowsiness and altered mental states. We also discussed that there is always a risk not just to her but also people around her. she has been encouraged to call the office with any questions or concerns that should arise related to todays visit.  No orders of the defined types were placed in this encounter.       I have personally obtained a history, evaluated the patient, evaluated pertinent data, formulated the assessment and plan and placed orders.  This patient was seen by Drema Dallas, PA-C in collaboration with Dr. Devona Konig as a part of collaborative care agreement.   Richelle Ito Saunders Glance, PhD, FAASM  Diplomate, American Board of Sleep Medicine    Allyne Gee, MD [facility] Diplomate ABMS Pulmonary and Critical Care Medicine Sleep medicine

## 2021-02-18 ENCOUNTER — Other Ambulatory Visit: Payer: Self-pay | Admitting: Family Medicine

## 2021-02-18 DIAGNOSIS — Z1231 Encounter for screening mammogram for malignant neoplasm of breast: Secondary | ICD-10-CM

## 2021-03-13 ENCOUNTER — Ambulatory Visit: Payer: BC Managed Care – PPO

## 2021-04-16 NOTE — Progress Notes (Signed)
[facility] [address] [place], [place] [address]  Pulmonary Sleep Medicine   Office Visit Note  Patient Name: Brianna Mckenzie DOB: 1966/11/14 MRN [id number]    Chief Complaint: Obstructive Sleep Apnea visit  Brief History:  Brianna Mckenzie is seen today for initial consult  follow up after setup APAP@10 -20cmH20.  The patient has a 2 month history of sleep apnea. Patient is using PAP nightly.  The patient feels rested after sleeping with PAP.  The patient reports benefiting  from PAP use. Patient reports she changes sleep positions frequently and has learned to adjust Brianna Mckenzie mask for this . Patient reports she had Covid in early May  and did not use machine for 2 weeks and has used machine every night since. Patient no longer has morning headaches.  Reported sleepiness is  Improved and the Epworth Sleepiness Score is 4 out of 24. The patient takes occasional naps on the weekends. The compliance download shows 63% compliance with an average use time of 6.3 hours. The AHI is 1.4  The patient does not complain of limb movements disrupting sleep.  ROS  General: (-) fever, (-) chills, (-) night sweat Nose and Sinuses: (-) nasal stuffiness or itchiness, (-) postnasal drip, (-) nosebleeds, (-) sinus trouble. Mouth and Throat: (-) sore throat, (-) hoarseness. Neck: (-) swollen glands, (-) enlarged thyroid, (-) neck pain. Respiratory: + cough, - shortness of breath, - wheezing. Neurologic: - numbness, - tingling. Psychiatric: - anxiety, - depression   Current Medication: Outpatient Encounter Medications as of 04/17/2021  Medication Sig Note  . ALLEGRA-D ALLERGY & CONGESTION 180-240 MG per 24 hr tablet TAKE 1 TAB BY MOUTH ONCE A DAY 05/08/2015: Received from: External Pharmacy  . aspirin EC 81 MG tablet Take by mouth. 05/08/2015: Received from: [facility]  . B Complex Vitamins (VITAMIN B COMPLEX PO) Take by mouth.   . butalbital-acetaminophen-caffeine (FIORICET,  ESGIC) 50-325-40 MG per tablet TAKE 1 TABLET BY MOUTH EVERY 6 HOURS AS NEEDED FOR MIGRAINE 05/08/2015: Received from: External Pharmacy  . Cetirizine HCl 10 MG CAPS Take by mouth.   . EPINEPHrine (EPIPEN 2-PAK) 0.3 mg/0.3 mL IJ SOAJ injection Inject into the muscle once.   Marland Kitchen FLUoxetine (PROZAC) 20 MG tablet Take by mouth.   Marland Kitchen FLUoxetine (PROZAC) 40 MG capsule Take 40 mg by mouth daily. PM 05/08/2015: Received from: External Pharmacy  . lisinopril (ZESTRIL) 40 MG tablet Take 40 mg by mouth daily.   . ondansetron (ZOFRAN) 4 MG tablet Take 4 mg by mouth every 8 (eight) hours as needed for nausea or vomiting.   . traZODone (DESYREL) 50 MG tablet Take 50 mg by mouth at bedtime.    No facility-administered encounter medications on file as of 04/17/2021.    Surgical History: Past Surgical History:  Procedure Laterality Date  . ABDOMINAL HYSTERECTOMY    . APPENDECTOMY    . CHOLECYSTECTOMY    . COLONOSCOPY WITH PROPOFOL N/A 05/18/2015   Procedure: COLONOSCOPY WITH PROPOFOL;  Surgeon: Midge Minium, MD;  Location: [facility];  Service: Endoscopy;  Laterality: N/A;  . MENISCUS REPAIR Right   . TENDON REPAIR Left    thumb    Medical History: Past Medical History:  Diagnosis Date  . Complication of anesthesia    causes migraines and PONV  . Family history of adverse reaction to anesthesia    causes migraines and PONV  . GERD (gastroesophageal reflux disease)   . Headache    migraines  . Hypertension   .  Knee pain    left  . PONV (postoperative nausea and vomiting)     Family History: Non contributory to the present illness  Social History: Social History   Socioeconomic History  . Marital status: Married    Spouse name: Not on file  . Number of children: Not on file  . Years of education: Not on file  . Highest education level: Not on file  Occupational History  . Not on file  Tobacco Use  . Smoking status: Never Smoker  . Smokeless tobacco: Never Used  Substance and  Sexual Activity  . Alcohol use: No  . Drug use: Never  . Sexual activity: Not on file  Other Topics Concern  . Not on file  Social History Narrative  . Not on file   Social Determinants of Health   Financial Resource Strain: Not on file  Food Insecurity: Not on file  Transportation Needs: Not on file  Physical Activity: Not on file  Stress: Not on file  Social Connections: Not on file  Intimate Partner Violence: Not on file    Vital Signs: Blood pressure 131/84, pulse 100, temperature (!) 97.3 F (36.3 C), resp. rate 18, height 5' (1.524 m), weight 244 lb (110.7 kg), SpO2 96 %.  Examination: General Appearance: The patient is well-developed, well-nourished, and in no distress. Neck Circumference: 43 cm Skin: Gross inspection of skin unremarkable. Head: normocephalic, no gross deformities. Eyes: no gross deformities noted. ENT: ears appear grossly normal Neurologic: Alert and oriented. No involuntary movements.    EPWORTH SLEEPINESS SCALE:  Scale:  (0)= no chance of dozing; (1)= slight chance of dozing; (2)= moderate chance of dozing; (3)= high chance of dozing  Chance  Situtation    Sitting and reading: 1    Watching TV: 1    Sitting Inactive in public: 0    As a passenger in car: 1      Lying down to rest: 2    Sitting and talking: 0    Sitting quielty after lunch: 0    In a car, stopped in traffic: 0   TOTAL SCORE:   4 out of 24    SLEEP STUDIES:  1. PSG 02/04/21 AHI 5.2 Spo5min 92%   CPAP COMPLIANCE DATA:  Date Range: 03/13/21-04/11/21  Average Daily Use: 8.3 hours  Median Use: 8.2  Compliance for > 4 Hours: 63%   AHI: 1.4 respiratory events per hour  Days Used: 19/30  Mask Leak: 23.1  95th Percentile Pressure: APAP 10-20         LABS: No results found for this or any previous visit (from the past 2160 hour(s)).  Radiology: MM 3D SCREEN BREAST BILATERAL  Result Date: 03/12/2020 CLINICAL DATA:  Screening. EXAM: DIGITAL  SCREENING BILATERAL MAMMOGRAM WITH TOMO AND CAD COMPARISON:  Previous exam(s). ACR Breast Density Category a: The breast tissue is almost entirely fatty. FINDINGS: There are no findings suspicious for malignancy. Images were processed with CAD. IMPRESSION: No mammographic evidence of malignancy. A result letter of this screening mammogram will be mailed directly to the patient. RECOMMENDATION: Screening mammogram in one year. (Code:SM-B-01Y) BI-RADS CATEGORY  1: Negative. Electronically Signed   By: Frederico Hamman M.D.   On: 03/12/2020 15:01    No results found.  No results found.    Assessment and Plan: Patient Active Problem List   Diagnosis Date Noted  . Special screening for malignant neoplasms, colon   . First degree hemorrhoids   . Calculus of kidney 05/08/2015  .  Gastro-esophageal reflux disease without esophagitis 04/25/2015  . Essential (primary) hypertension 06/12/2011      The patient does tolerate PAP and reports benefit from PAP use. The patient was reminded how to adjust mask fit and advised to changes supplies regularly. The patient was also counselled on nightly use. The compliance is poor on download, however this is due to patient having covid for 2 weeks and being unable to use it at the start--excellent compliance since being able to start. The AHI is 1.4.  1. OSA (obstructive sleep apnea) Continue nightly use  2. CPAP use counseling CPAP couseling-Discussed importance of adequate CPAP use as well as proper care and cleaning techniques of machine and all supplies.  3. Essential (primary) hypertension Continue current medication and f/u with PCP.  4. Gastro-esophageal reflux disease without esophagitis Continue PPI  5. Depression, major, single episode, mild (HCC) Continue current medication and f/u with PCP.  6. Morbid obesity with BMI 45.0-49.9, adult (HCC) Obesity Counseling: Had a lengthy discussion regarding patients BMI and weight issues. Patient was  instructed on portion control as well as increased activity. Also discussed caloric restrictions with trying to maintain intake less than 2000 Kcal. Discussions were made in accordance with the 5As of weight management. Simple actions such as not eating late and if able to, taking a walk is suggested.   General Counseling: I have discussed the findings of the evaluation and examination with Brianna Mckenzie.  I have also discussed any further diagnostic evaluation thatmay be needed or ordered today. Brianna Mckenzie verbalizes understanding of the findings of todays visit. We also reviewed Brianna Mckenzie medications today and discussed drug interactions and side effects including but not limited excessive drowsiness and altered mental states. We also discussed that there is always a risk not just to Brianna Mckenzie but also people around Brianna Mckenzie. she has been encouraged to call the office with any questions or concerns that should arise related to todays visit.  No orders of the defined types were placed in this encounter.       I have personally obtained a history, examined the patient, evaluated laboratory and imaging results, formulated the assessment and plan and placed orders.  This patient was seen by Lynn Ito, PA-C in collaboration with Dr. Freda Munro as a part of collaborative care agreement.   Valentino Hue Sol Blazing, PhD, FAASM  Diplomate, American Board of Sleep Medicine    Yevonne Pax, MD [facility] Diplomate ABMS Pulmonary and Critical Care Medicine Sleep medicine

## 2021-04-17 ENCOUNTER — Ambulatory Visit (INDEPENDENT_AMBULATORY_CARE_PROVIDER_SITE_OTHER): Payer: BC Managed Care – PPO | Admitting: Internal Medicine

## 2021-04-17 DIAGNOSIS — F320 Major depressive disorder, single episode, mild: Secondary | ICD-10-CM

## 2021-04-17 DIAGNOSIS — G4733 Obstructive sleep apnea (adult) (pediatric): Secondary | ICD-10-CM

## 2021-04-17 DIAGNOSIS — Z6841 Body Mass Index (BMI) 40.0 and over, adult: Secondary | ICD-10-CM

## 2021-04-17 DIAGNOSIS — I10 Essential (primary) hypertension: Secondary | ICD-10-CM | POA: Diagnosis not present

## 2021-04-17 DIAGNOSIS — K219 Gastro-esophageal reflux disease without esophagitis: Secondary | ICD-10-CM | POA: Diagnosis not present

## 2021-04-17 DIAGNOSIS — Z7189 Other specified counseling: Secondary | ICD-10-CM | POA: Diagnosis not present

## 2021-04-17 NOTE — Patient Instructions (Signed)

## 2021-05-16 ENCOUNTER — Other Ambulatory Visit: Payer: Self-pay

## 2021-05-16 ENCOUNTER — Ambulatory Visit
Admission: RE | Admit: 2021-05-16 | Discharge: 2021-05-16 | Disposition: A | Discharge status: Home / Self Care | Payer: BC Managed Care – PPO | Source: Ambulatory Visit | Attending: Family Medicine | Admitting: Family Medicine

## 2021-05-16 DIAGNOSIS — Z1231 Encounter for screening mammogram for malignant neoplasm of breast: Secondary | ICD-10-CM | POA: Insufficient documentation

## 2021-08-20 ENCOUNTER — Ambulatory Visit (INDEPENDENT_AMBULATORY_CARE_PROVIDER_SITE_OTHER): Payer: BC Managed Care – PPO | Admitting: Internal Medicine

## 2021-08-20 ENCOUNTER — Other Ambulatory Visit: Payer: Self-pay

## 2021-08-20 VITALS — BP 111/81 | HR 86 | Resp 18 | Ht 59.0 in | Wt 214.0 lb

## 2021-08-20 DIAGNOSIS — I10 Essential (primary) hypertension: Secondary | ICD-10-CM | POA: Diagnosis not present

## 2021-08-20 DIAGNOSIS — Z9989 Dependence on other enabling machines and devices: Secondary | ICD-10-CM

## 2021-08-20 DIAGNOSIS — G4733 Obstructive sleep apnea (adult) (pediatric): Secondary | ICD-10-CM | POA: Diagnosis not present

## 2021-08-20 DIAGNOSIS — Z7189 Other specified counseling: Secondary | ICD-10-CM | POA: Insufficient documentation

## 2021-08-20 NOTE — Patient Instructions (Signed)

## 2021-08-20 NOTE — Progress Notes (Signed)
[facility] [address] [place], [place] [address]  Pulmonary Sleep Medicine   Office Visit Note  Patient Name: Brianna Mckenzie DOB: 1965-12-23 MRN [id number]    Chief Complaint: Obstructive Sleep Apnea visit  Brief History:  Tanai is seen today for follow up on APAP@10cmH20 . The patient has a   month history of sleep apnea. Patient is using PAP nightly.  The patient feels rested after sleeping with PAP.  The patient reports benefiting from PAP use. Reported sleepiness is  improved and the Epworth Sleepiness Score is 1 out of 24. The patient occasionally take naps. The patient complains of the following: does not love her mask  The compliance download shows 96% compliance with an average use time of 8 hours. The AHI is 0.8  The patient does not complain of limb movements disrupting sleep.  ROS  General: (-) fever, (-) chills, (-) night sweat Nose and Sinuses: (-) nasal stuffiness or itchiness, (-) postnasal drip, (-) nosebleeds, (-) sinus trouble. Mouth and Throat: (-) sore throat, (-) hoarseness. Neck: (-) swollen glands, (-) enlarged thyroid, (-) neck pain. Respiratory: - cough, - shortness of breath, - wheezing. Neurologic: - numbness, - tingling. Psychiatric: - anxiety, - depression   Current Medication: Outpatient Encounter Medications as of 08/20/2021  Medication Sig Note   butalbital-acetaminophen-caffeine (FIORICET) 50-325-40 MG tablet Take by mouth.    ondansetron (ZOFRAN) 4 MG tablet Take by mouth.    ALLEGRA-D ALLERGY & CONGESTION 180-240 MG per 24 hr tablet TAKE 1 TAB BY MOUTH ONCE A DAY 05/08/2015: Received from: External Pharmacy   aspirin EC 81 MG tablet Take by mouth. 05/08/2015: Received from: [facility]   B Complex Vitamins (VITAMIN B COMPLEX PO) Take by mouth.    butalbital-acetaminophen-caffeine (FIORICET, ESGIC) 50-325-40 MG per tablet TAKE 1 TABLET BY MOUTH EVERY 6 HOURS AS NEEDED FOR MIGRAINE 05/08/2015: Received from:  External Pharmacy   Cetirizine HCl 10 MG CAPS Take by mouth.    EPINEPHrine (EPIPEN 2-PAK) 0.3 mg/0.3 mL IJ SOAJ injection Inject into the muscle once.    FLUoxetine (PROZAC) 20 MG tablet Take by mouth.    FLUoxetine (PROZAC) 40 MG capsule Take 40 mg by mouth daily. PM 05/08/2015: Received from: External Pharmacy   FLUoxetine (PROZAC) 40 MG capsule fluoxetine 40 mg capsule    fluticasone (FLONASE) 50 MCG/ACT nasal spray fluticasone propionate 50 mcg/actuation nasal spray,suspension    lisinopril (ZESTRIL) 10 MG tablet Take 10 mg by mouth daily.    lisinopril (ZESTRIL) 40 MG tablet Take 40 mg by mouth daily.    ondansetron (ZOFRAN) 4 MG tablet Take 4 mg by mouth every 8 (eight) hours as needed for nausea or vomiting.    OZEMPIC, 0.25 OR 0.5 MG/DOSE, 2 MG/1.5ML SOPN Inject into the skin.    traZODone (DESYREL) 50 MG tablet Take 50 mg by mouth at bedtime.    No facility-administered encounter medications on file as of 08/20/2021.    Surgical History: Past Surgical History:  Procedure Laterality Date   ABDOMINAL HYSTERECTOMY     APPENDECTOMY     CHOLECYSTECTOMY     COLONOSCOPY WITH PROPOFOL N/A 05/18/2015   Procedure: COLONOSCOPY WITH PROPOFOL;  Surgeon: Midge Minium, MD;  Location: [facility];  Service: Endoscopy;  Laterality: N/A;   MENISCUS REPAIR Right    TENDON REPAIR Left    thumb    Medical History: Past Medical History:  Diagnosis Date   Complication of anesthesia    causes migraines and PONV  Family history of adverse reaction to anesthesia    causes migraines and PONV   GERD (gastroesophageal reflux disease)    Headache    migraines   Hypertension    Knee pain    left   PONV (postoperative nausea and vomiting)     Family History: Non contributory to the present illness  Social History: Social History   Socioeconomic History   Marital status: Married    Spouse name: Not on file   Number of children: Not on file   Years of education: Not on file    Highest education level: Not on file  Occupational History   Not on file  Tobacco Use   Smoking status: Never   Smokeless tobacco: Never  Substance and Sexual Activity   Alcohol use: No   Drug use: Never   Sexual activity: Not on file  Other Topics Concern   Not on file  Social History Narrative   Not on file   Social Determinants of Health   Financial Resource Strain: Not on file  Food Insecurity: Not on file  Transportation Needs: Not on file  Physical Activity: Not on file  Stress: Not on file  Social Connections: Not on file  Intimate Partner Violence: Not on file    Vital Signs: Blood pressure 111/81, pulse 86, resp. rate 18, height 4\' 11"  (1.499 m), weight 214 lb (97.1 kg), SpO2 96 %.  Examination: General Appearance: The patient is well-developed, well-nourished, and in no distress. Neck Circumference: 42 Skin: Gross inspection of skin unremarkable. Head: normocephalic, no gross deformities. Eyes: no gross deformities noted. ENT: ears appear grossly normal Neurologic: Alert and oriented. No involuntary movements.    EPWORTH SLEEPINESS SCALE:  Scale:  (0)= no chance of dozing; (1)= slight chance of dozing; (2)= moderate chance of dozing; (3)= high chance of dozing  Chance  Situtation    Sitting and reading: 0    Watching TV: 0    Sitting Inactive in public: 0    As a passenger in car: 1      Lying down to rest: 0    Sitting and talking: 0    Sitting quielty after lunch: 0    In a car, stopped in traffic: 0   TOTAL SCORE:   1 out of 24    SLEEP STUDIES:  HST 01/26/21 AHI 5 Spo28min 91%    CPAP COMPLIANCE DATA:  Date Range: 04/22/21-08/19/21  Average Daily Use: 8 hours  Median Use: 8.3  Compliance for > 4 Hours: 96%   AHI: 0.8 respiratory events per hour  Days Used: 117/120  Mask Leak: 12.6  95th Percentile Pressure: APAP 10-20         LABS: No results found for this or any previous visit (from the past 2160  hour(s)).  Radiology: MM 3D SCREEN BREAST BILATERAL  Result Date: 05/21/2021 CLINICAL DATA:  Screening. EXAM: DIGITAL SCREENING BILATERAL MAMMOGRAM WITH TOMOSYNTHESIS AND CAD TECHNIQUE: Bilateral screening digital craniocaudal and mediolateral oblique mammograms were obtained. Bilateral screening digital breast tomosynthesis was performed. The images were evaluated with computer-aided detection. COMPARISON:  Previous exam(s). ACR Breast Density Category b: There are scattered areas of fibroglandular density. FINDINGS: There are no findings suspicious for malignancy. IMPRESSION: No mammographic evidence of malignancy. A result letter of this screening mammogram will be mailed directly to the patient. RECOMMENDATION: Screening mammogram in one year. (Code:SM-B-01Y) BI-RADS CATEGORY  1: Negative. Electronically Signed   By: 07/22/2021 M.D.   On: 05/21/2021 13:43  No results found.  No results found.    Assessment and Plan: Patient Active Problem List   Diagnosis Date Noted   OSA on CPAP 08/20/2021   CPAP use counseling 08/20/2021   Morbid obesity (HCC) 08/20/2021   Special screening for malignant neoplasms, colon    First degree hemorrhoids    Calculus of kidney 05/08/2015   Gastro-esophageal reflux disease without esophagitis 04/25/2015   Essential (primary) hypertension 06/12/2011   1. OSA on CPAP The patient does  tolerate PAP and reports definite benefit from PAP use. The patient was reminded how to clean equipment and advised to replace supplies routinely. The patient was also counselled on weight loss. The compliance is excellent. The AHI is 0.8.   OSA- continue with excellent compliance.    2. CPAP use counseling CPAP Counseling: had a lengthy discussion with the patient regarding the importance of PAP therapy in management of the sleep apnea. Patient appears to understand the risk factor reduction and also understands the risks associated with untreated sleep apnea.  Patient will try to make a good faith effort to remain compliant with therapy. Also instructed the patient on proper cleaning of the device including the water must be changed daily if possible and use of distilled water is preferred. Patient understands that the machine should be regularly cleaned with appropriate recommended cleaning solutions that do not damage the PAP machine for example given white vinegar and water rinses. Other methods such as ozone treatment may not be as good as these simple methods to achieve cleaning.   3. Morbid obesity (HCC) Obesity Counseling: Had a lengthy discussion regarding patients BMI and weight issues. Patient was instructed on portion control as well as increased activity. Also discussed caloric restrictions with trying to maintain intake less than 2000 Kcal. Discussions were made in accordance with the 5As of weight management. Simple actions such as not eating late and if able to, taking a walk is suggested.   4. Essential (primary) hypertension Hypertension Counseling:   The following hypertensive lifestyle modification were recommended and discussed:  1. Limiting alcohol intake to less than 1 oz/day of ethanol:(24 oz of beer or 8 oz of wine or 2 oz of 100-proof whiskey). 2. Take baby ASA 81 mg daily. 3. Importance of regular aerobic exercise and losing weight. 4. Reduce dietary saturated fat and cholesterol intake for overall cardiovascular health. 5. Maintaining adequate dietary potassium, calcium, and magnesium intake. 6. Regular monitoring of the blood pressure. 7. Reduce sodium intake to less than 100 mmol/day (less than 2.3 gm of sodium or less than 6 gm of sodium choride)       General Counseling: I have discussed the findings of the evaluation and examination with Brianna Mckenzie.  I have also discussed any further diagnostic evaluation thatmay be needed or ordered today. Brianna Mckenzie verbalizes understanding of the findings of todays visit. We also reviewed  her medications today and discussed drug interactions and side effects including but not limited excessive drowsiness and altered mental states. We also discussed that there is always a risk not just to her but also people around her. she has been encouraged to call the office with any questions or concerns that should arise related to todays visit.  No orders of the defined types were placed in this encounter.       I have personally obtained a history, examined the patient, evaluated laboratory and imaging results, formulated the assessment and plan and placed orders.   This patient was seen today by  Brianna Kluver, PA-C in collaboration with Dr. Freda Munro.    Yevonne Pax, MD [facility] Diplomate ABMS Pulmonary and Critical Care Medicine Sleep medicine

## 2022-04-09 ENCOUNTER — Other Ambulatory Visit: Payer: Self-pay | Admitting: Internal Medicine

## 2022-04-09 DIAGNOSIS — I10 Essential (primary) hypertension: Secondary | ICD-10-CM

## 2022-04-09 DIAGNOSIS — R0789 Other chest pain: Secondary | ICD-10-CM

## 2022-04-10 ENCOUNTER — Ambulatory Visit
Admission: RE | Admit: 2022-04-10 | Discharge: 2022-04-10 | Disposition: A | Discharge status: Home / Self Care | Payer: BC Managed Care – PPO | Source: Ambulatory Visit | Attending: Family Medicine | Admitting: Family Medicine

## 2022-04-10 DIAGNOSIS — R0789 Other chest pain: Secondary | ICD-10-CM | POA: Insufficient documentation

## 2022-04-10 DIAGNOSIS — I10 Essential (primary) hypertension: Secondary | ICD-10-CM | POA: Insufficient documentation

## 2022-05-29 ENCOUNTER — Other Ambulatory Visit: Payer: Self-pay | Admitting: Family Medicine

## 2022-05-29 DIAGNOSIS — Z1231 Encounter for screening mammogram for malignant neoplasm of breast: Secondary | ICD-10-CM

## 2022-06-02 ENCOUNTER — Ambulatory Visit
Admission: RE | Admit: 2022-06-02 | Discharge: 2022-06-02 | Disposition: A | Discharge status: Home / Self Care | Payer: Commercial Managed Care - PPO | Source: Ambulatory Visit | Attending: Family Medicine | Admitting: Family Medicine

## 2022-06-02 DIAGNOSIS — Z1231 Encounter for screening mammogram for malignant neoplasm of breast: Secondary | ICD-10-CM | POA: Diagnosis present

## 2022-08-19 NOTE — Progress Notes (Signed)
[facility] [place], [place] [address]  Pulmonary Sleep Medicine   Office Visit Note  Patient Name: Brianna Mckenzie DOB: [birth year] MRN [id number]    Chief Complaint: Obstructive Sleep Apnea visit  Brief History:  Brianna Mckenzie is seen today for an annual follow up visit for APAP@ 10-20 cmH2O. The patient has a 1 year history of sleep apnea. Patient is using PAP nightly.  The patient feels rested after sleeping with PAP when she gets at least 5 hrs. Patient reports she is going thru menopause and it is causing some insomnia.  The patient reports benefiting from PAP use. Reported sleepiness is  improved and the Epworth Sleepiness Score is 4 out of 24. The patient does not take naps. The patient complains of the following: mask leaving a mark on her forehead. We suspect she is over tightening. She will work on this and explore a different mask when she is eligible again. The compliance download shows 74% compliance with an average use time of 6 hours 17 minutes. The AHI is 0.9.  The patient does not complain of limb movements disrupting sleep. The patient continues to require PAP therapy in order to eliminate her sleep apnea. She is dealing with menopausal symtpoms that can impact sleep some nights which leads to some shorter nights of use. She reports trazodone helps tremendously however she doesn't take every night as she feel groggy in the morning and plans to discuss with her PCP about lowering dose or trying an alternative.  ROS  General: (-) fever, (-) chills, (-) night sweat Nose and Sinuses: (-) nasal stuffiness or itchiness, (-) postnasal drip, (-) nosebleeds, (-) sinus trouble. Mouth and Throat: (-) sore throat, (-) hoarseness. Neck: (-) swollen glands, (-) enlarged thyroid, (-) neck pain. Respiratory: - cough, - shortness of breath, - wheezing. Neurologic: - numbness, - tingling. Psychiatric: - anxiety, - depression   Current Medication: Outpatient  Encounter Medications as of 08/20/2022  Medication Sig Note   ALLEGRA-D ALLERGY & CONGESTION 180-240 MG per 24 hr tablet TAKE 1 TAB BY MOUTH ONCE A DAY 05/08/2015: Received from: External Pharmacy   aspirin EC 81 MG tablet Take by mouth. 05/08/2015: Received from: [facility]   B Complex Vitamins (VITAMIN B COMPLEX PO) Take by mouth.    butalbital-acetaminophen-caffeine (FIORICET) 50-325-40 MG tablet Take by mouth.    butalbital-acetaminophen-caffeine (FIORICET, ESGIC) 50-325-40 MG per tablet TAKE 1 TABLET BY MOUTH EVERY 6 HOURS AS NEEDED FOR MIGRAINE 05/08/2015: Received from: External Pharmacy   Cetirizine HCl 10 MG CAPS Take by mouth.    EPINEPHrine (EPIPEN 2-PAK) 0.3 mg/0.3 mL IJ SOAJ injection Inject into the muscle once.    FLUoxetine (PROZAC) 20 MG tablet Take by mouth.    FLUoxetine (PROZAC) 40 MG capsule Take 40 mg by mouth daily. PM 05/08/2015: Received from: External Pharmacy   FLUoxetine (PROZAC) 40 MG capsule fluoxetine 40 mg capsule    fluticasone (FLONASE) 50 MCG/ACT nasal spray fluticasone propionate 50 mcg/actuation nasal spray,suspension    lisinopril (ZESTRIL) 10 MG tablet Take 10 mg by mouth daily.    lisinopril (ZESTRIL) 40 MG tablet Take 40 mg by mouth daily.    ondansetron (ZOFRAN) 4 MG tablet Take 4 mg by mouth every 8 (eight) hours as needed for nausea or vomiting.    ondansetron (ZOFRAN) 4 MG tablet Take by mouth.    OZEMPIC, 0.25 OR 0.5 MG/DOSE, 2 MG/1.5ML SOPN Inject into the skin.    traZODone (DESYREL) 50 MG tablet  Take 50 mg by mouth at bedtime.    No facility-administered encounter medications on file as of 08/20/2022.    Surgical History: Past Surgical History:  Procedure Laterality Date   ABDOMINAL HYSTERECTOMY     APPENDECTOMY     CHOLECYSTECTOMY     COLONOSCOPY WITH PROPOFOL N/A 05/18/2015   Procedure: COLONOSCOPY WITH PROPOFOL;  Surgeon: Lucilla Lame, MD;  Location: [place];  Service: Endoscopy;  Laterality: N/A;   MENISCUS  REPAIR Right    TENDON REPAIR Left    thumb    Medical History: Past Medical History:  Diagnosis Date   Complication of anesthesia    causes migraines and PONV   Family history of adverse reaction to anesthesia    causes migraines and PONV   GERD (gastroesophageal reflux disease)    Headache    migraines   Hypertension    Knee pain    left   PONV (postoperative nausea and vomiting)     Family History: Non contributory to the present illness  Social History: Social History   Socioeconomic History   Marital status: Married    Spouse name: Not on file   Number of children: Not on file   Years of education: Not on file   Highest education level: Not on file  Occupational History   Not on file  Tobacco Use   Smoking status: Never   Smokeless tobacco: Never  Substance and Sexual Activity   Alcohol use: No   Drug use: Never   Sexual activity: Not on file  Other Topics Concern   Not on file  Social History Narrative   Not on file   Social Determinants of Health   Financial Resource Strain: Not on file  Food Insecurity: Not on file  Transportation Needs: Not on file  Physical Activity: Not on file  Stress: Not on file  Social Connections: Not on file  Intimate Partner Violence: Not on file    Vital Signs: Blood pressure 118/83, pulse 70, resp. rate 12, height 4' 11.5" (1.511 m), weight 210 lb 9.6 oz (95.5 kg), SpO2 97 %. Body mass index is 41.82 kg/m.    Examination: General Appearance: The patient is well-developed, well-nourished, and in no distress. Neck Circumference: 42cm Skin: Gross inspection of skin unremarkable. Head: normocephalic, no gross deformities. Eyes: no gross deformities noted. ENT: ears appear grossly normal Neurologic: Alert and oriented. No involuntary movements.  STOP BANG RISK ASSESSMENT S (snore) Have you been told that you snore?     YES/N   T (tired) Are you often tired, fatigued, or sleepy during the day?   YES/NO  O  (obstruction) Do you stop breathing, choke, or gasp during sleep? YES/NO   P (pressure) Do you have or are you being treated for high blood pressure? YES/NO   B (BMI) Is your body index greater than 35 kg/m? YES/NO   A (age) Are you 67 years old or older? YES/NO   N (neck) Do you have a neck circumference greater than 16 inches?   YES/NO   G (gender) Are you a female? YES/NO   TOTAL STOP/BANG "YES" ANSWERS        A STOP-Bang score of 2 or less is considered low risk, and a score of 5 or more is high risk for having either moderate or severe OSA. For people who score 3 or 4, doctors may need to perform further assessment to determine how likely they are to have OSA.  EPWORTH SLEEPINESS SCALE:  Scale:  (0)= no chance of dozing; (1)= slight chance of dozing; (2)= moderate chance of dozing; (3)= high chance of dozing  Chance  Situtation    Sitting and reading: 0    Watching TV: 0    Sitting Inactive in public: 0    As a passenger in car: 1      Lying down to rest: 3    Sitting and talking: 0    Sitting quielty after lunch: 0    In a car, stopped in traffic: 0   TOTAL SCORE:   4 out of 24    SLEEP STUDIES:  HST (01/2021) AHI 50/hr, min SpO2 86% Titration (01/2021) APAP@ 10-20 cmH2O   CPAP COMPLIANCE DATA:  Date Range: 08/18/2021-08/17/2022  Average Daily Use: 6 hours 17 minutes  Median Use: 6 hours 39 minutes  Compliance for > 4 Hours: 74%  AHI: 0.9 respiratory events per hour  Days Used: 333/365 days  Mask Leak: 3.1  95th Percentile Pressure: 14         LABS: No results found for this or any previous visit (from the past 2160 hour(s)).  Radiology: MM 3D SCREEN BREAST BILATERAL  Result Date: 06/03/2022 CLINICAL DATA:  Screening. EXAM: DIGITAL SCREENING BILATERAL MAMMOGRAM WITH TOMOSYNTHESIS AND CAD TECHNIQUE: Bilateral screening digital craniocaudal and mediolateral oblique mammograms were obtained. Bilateral screening digital breast  tomosynthesis was performed. The images were evaluated with computer-aided detection. COMPARISON:  Previous exam(s). ACR Breast Density Category b: There are scattered areas of fibroglandular density. FINDINGS: There are no findings suspicious for malignancy. IMPRESSION: No mammographic evidence of malignancy. A result letter of this screening mammogram will be mailed directly to the patient. RECOMMENDATION: Screening mammogram in one year. (Code:SM-B-01Y) BI-RADS CATEGORY  1: Negative. Electronically Signed   By: Audie Pinto M.D.   On: 06/03/2022 15:35    No results found.  No results found.    Assessment and Plan: Patient Active Problem List   Diagnosis Date Noted   OSA on CPAP 08/20/2021   CPAP use counseling 08/20/2021   Morbid obesity ([place]) 08/20/2021   Special screening for malignant neoplasms, colon    First degree hemorrhoids    Calculus of kidney 05/08/2015   Gastro-esophageal reflux disease without esophagitis 04/25/2015   Essential (primary) hypertension 06/12/2011      The patient does tolerate PAP and reports benefit from PAP use. The patient was reminded how to adjust mask fit and advised to schedule mask fit appt to try alternative mask style. The patient was also counselled on nightly use. The compliance is fair. The AHI is 0.9. Pt continues to require apap to treat her osa and is medically necessary.   1. OSA on CPAP Continue nightly use  2. CPAP use counseling CPAP couseling-Discussed importance of adequate CPAP use as well as proper care and cleaning techniques of machine and all supplies.  3. Essential (primary) hypertension Continue current medication and f/u with PCP.  4. Morbid obesity with BMI of 40.0-44.9, adult ([place]) Obesity Counseling: Had a lengthy discussion regarding patients BMI and weight issues. Patient was instructed on portion control as well as increased activity. Also discussed caloric restrictions with trying to maintain intake less  than 2000 Kcal. Discussions were made in accordance with the 5As of weight management. Simple actions such as not eating late and if able to, taking a walk is suggested.    General Counseling: I have discussed the findings of the evaluation and examination with Brianna Mckenzie.  I have also discussed any further diagnostic evaluation thatmay be needed or ordered today. Brianna Mckenzie verbalizes understanding of the findings of todays visit. We also reviewed her medications today and discussed drug interactions and side effects including but not limited excessive drowsiness and altered mental states. We also discussed that there is always a risk not just to her but also people around her. she has been encouraged to call the office with any questions or concerns that should arise related to todays visit.  No orders of the defined types were placed in this encounter.       I have personally obtained a history, examined the patient, evaluated laboratory and imaging results, formulated the assessment and plan and placed orders.  This patient was seen by Drema Dallas, PA-C in collaboration with Dr. Devona Konig as a part of collaborative care agreement.  Allyne Gee, MD [facility] Diplomate ABMS Pulmonary Critical Care Medicine and Sleep Medicine

## 2022-08-20 ENCOUNTER — Ambulatory Visit (INDEPENDENT_AMBULATORY_CARE_PROVIDER_SITE_OTHER): Payer: Commercial Managed Care - PPO | Admitting: Internal Medicine

## 2022-08-20 VITALS — BP 118/83 | HR 70 | Resp 12 | Ht 59.5 in | Wt 210.6 lb

## 2022-08-20 DIAGNOSIS — G4733 Obstructive sleep apnea (adult) (pediatric): Secondary | ICD-10-CM

## 2022-08-20 DIAGNOSIS — I10 Essential (primary) hypertension: Secondary | ICD-10-CM | POA: Diagnosis not present

## 2022-08-20 DIAGNOSIS — Z7189 Other specified counseling: Secondary | ICD-10-CM | POA: Diagnosis not present

## 2022-08-20 DIAGNOSIS — Z6841 Body Mass Index (BMI) 40.0 and over, adult: Secondary | ICD-10-CM

## 2022-08-20 NOTE — Patient Instructions (Signed)

## 2022-10-09 IMAGING — MG MM DIGITAL SCREENING BILAT W/ TOMO AND CAD
6 of 12 series · 6 of 36 positions shown · non-contrast
Comparison: Previous exam(s).

CLINICAL DATA: Screening.

EXAM:
DIGITAL SCREENING BILATERAL MAMMOGRAM WITH TOMOSYNTHESIS AND CAD
TECHNIQUE: Bilateral screening digital craniocaudal and mediolateral oblique
mammograms were obtained. Bilateral screening digital breast
tomosynthesis was performed. The images were evaluated with
computer-aided detection.

[R CC synth-2D (1 of 2)]
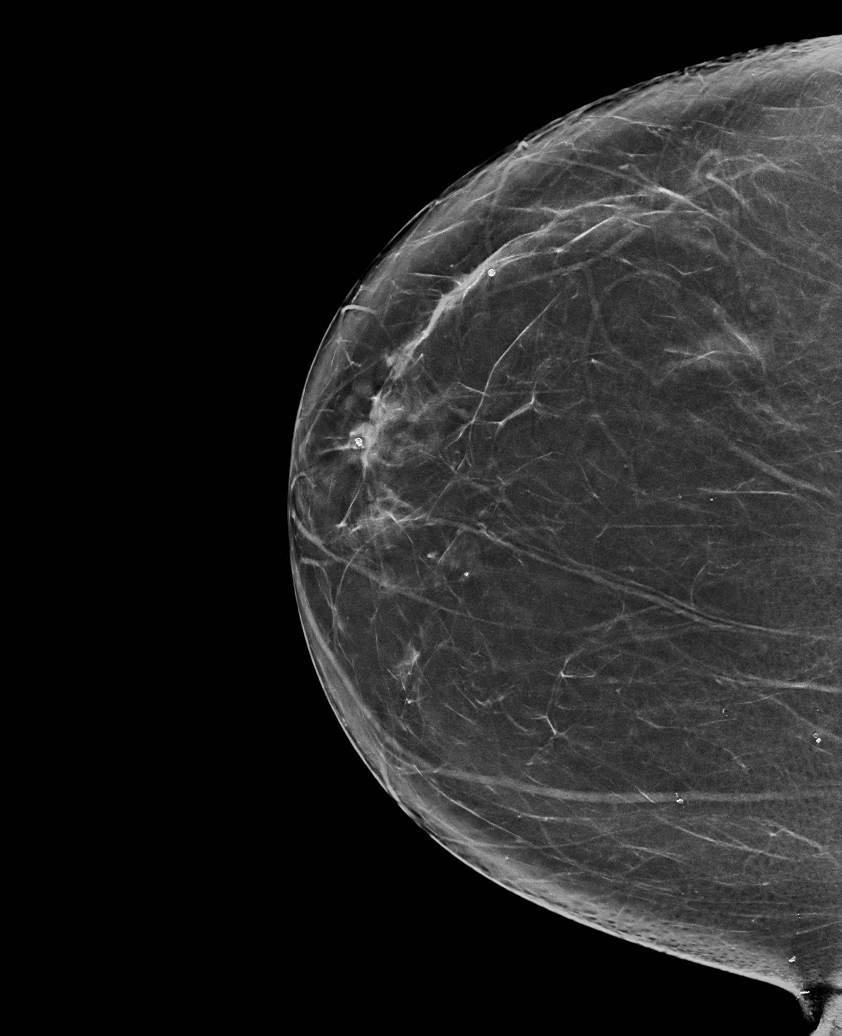

[R CC synth-2D (2 of 2)]
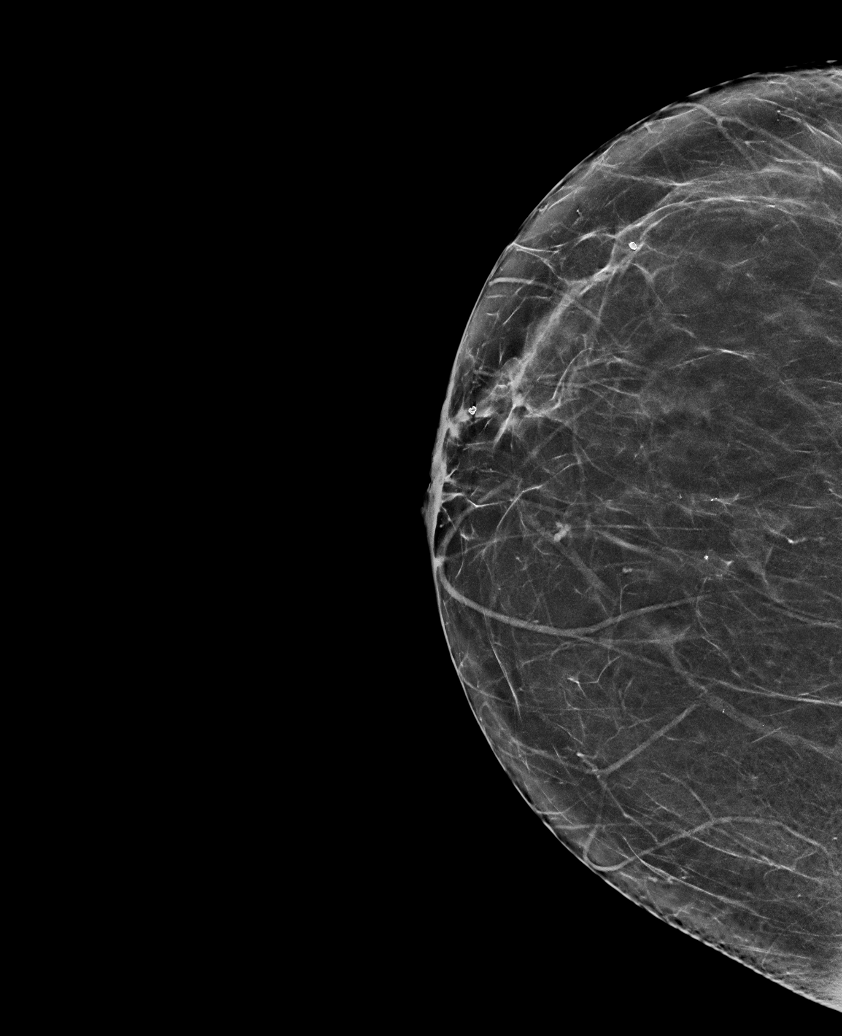

[R MLO synth-2D]
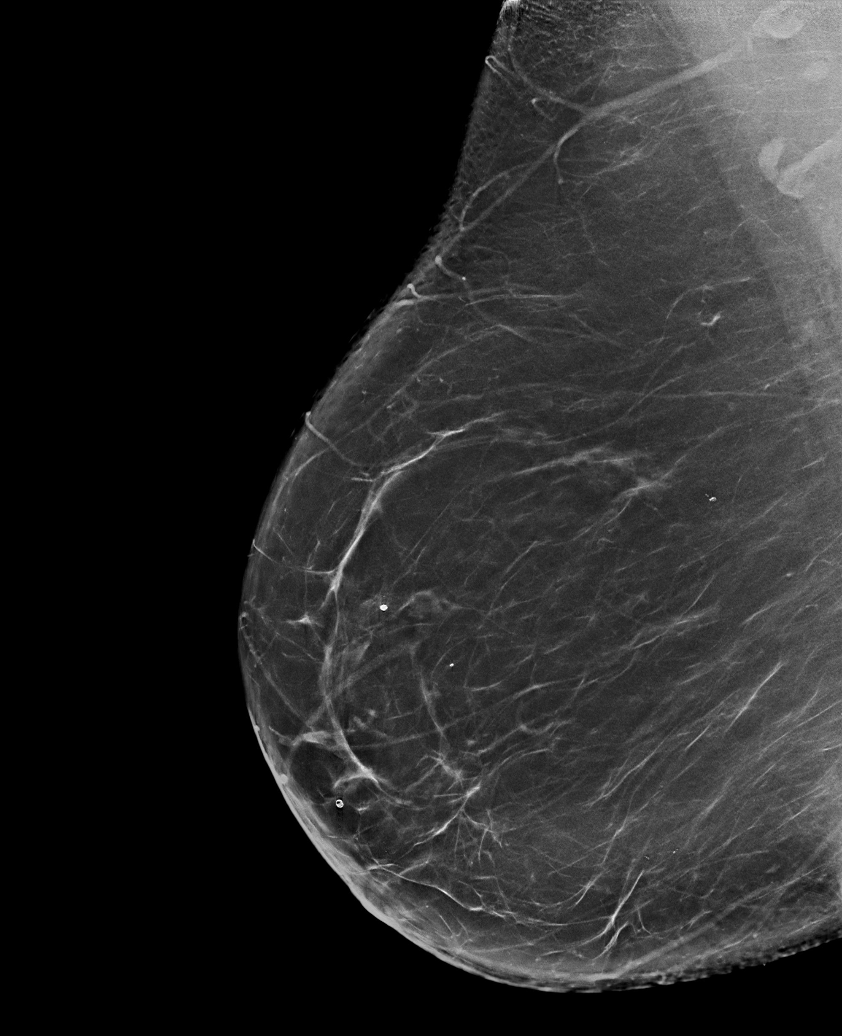

[L CC synth-2D]
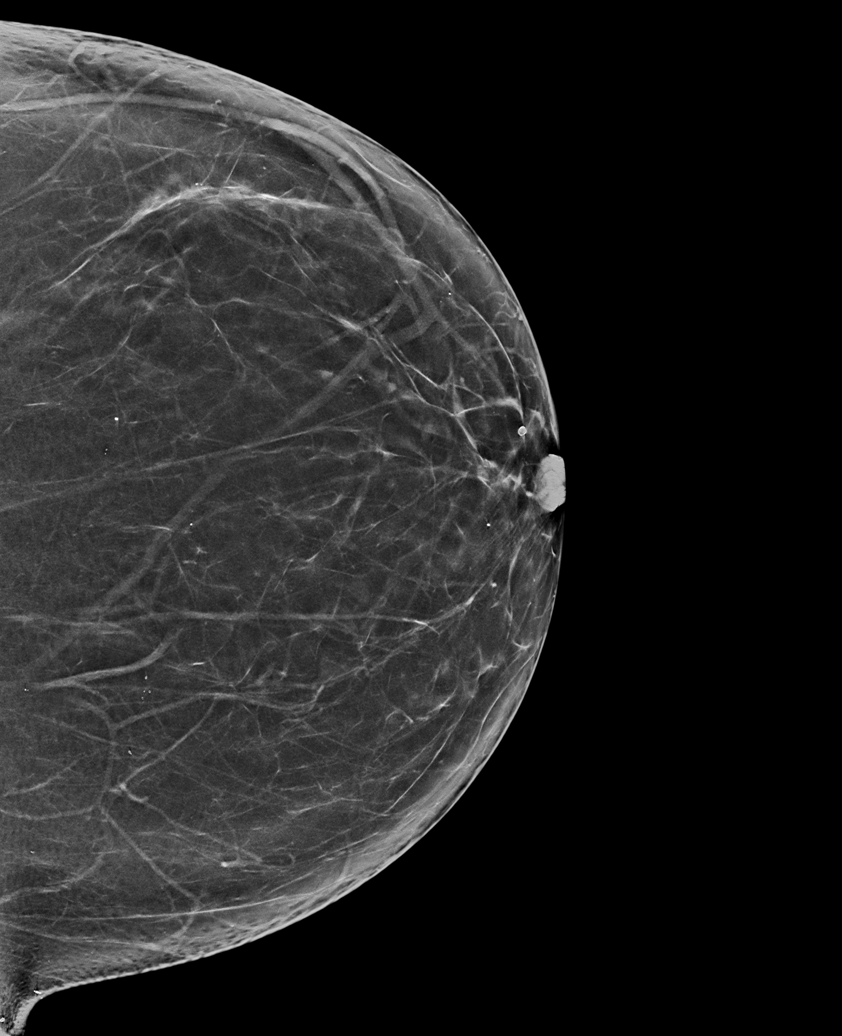

[L MLO synth-2D (1 of 2)]
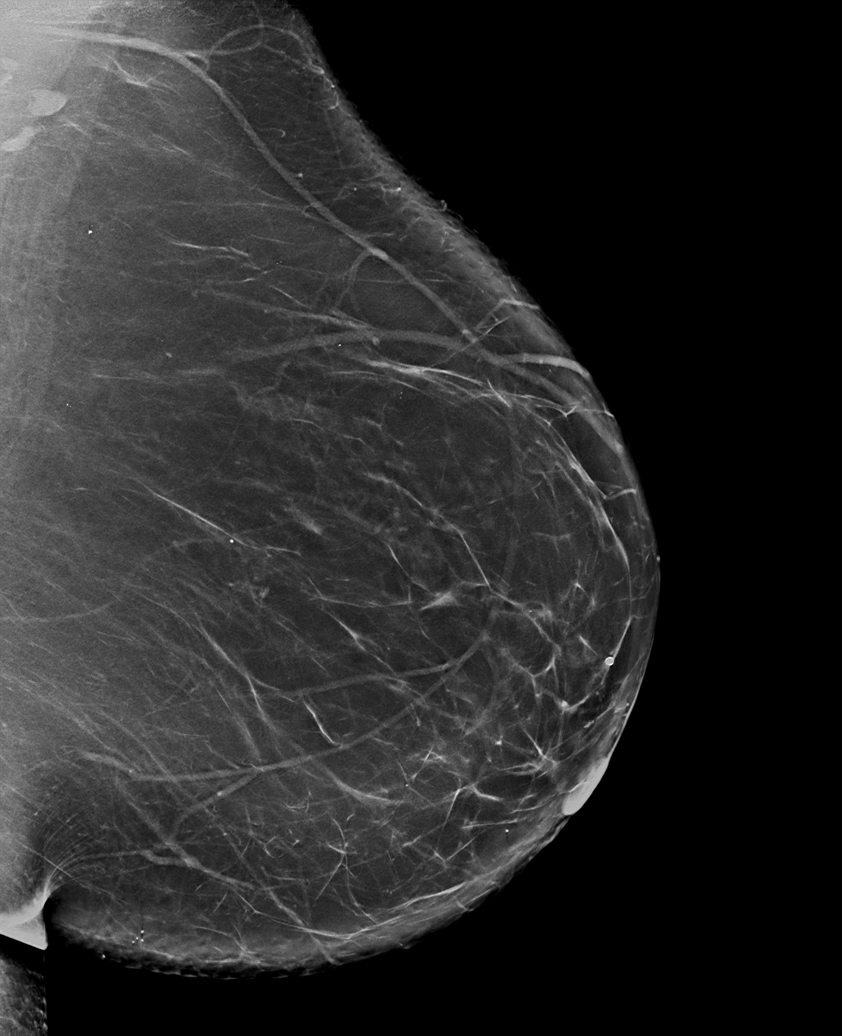

[L MLO synth-2D (2 of 2)]
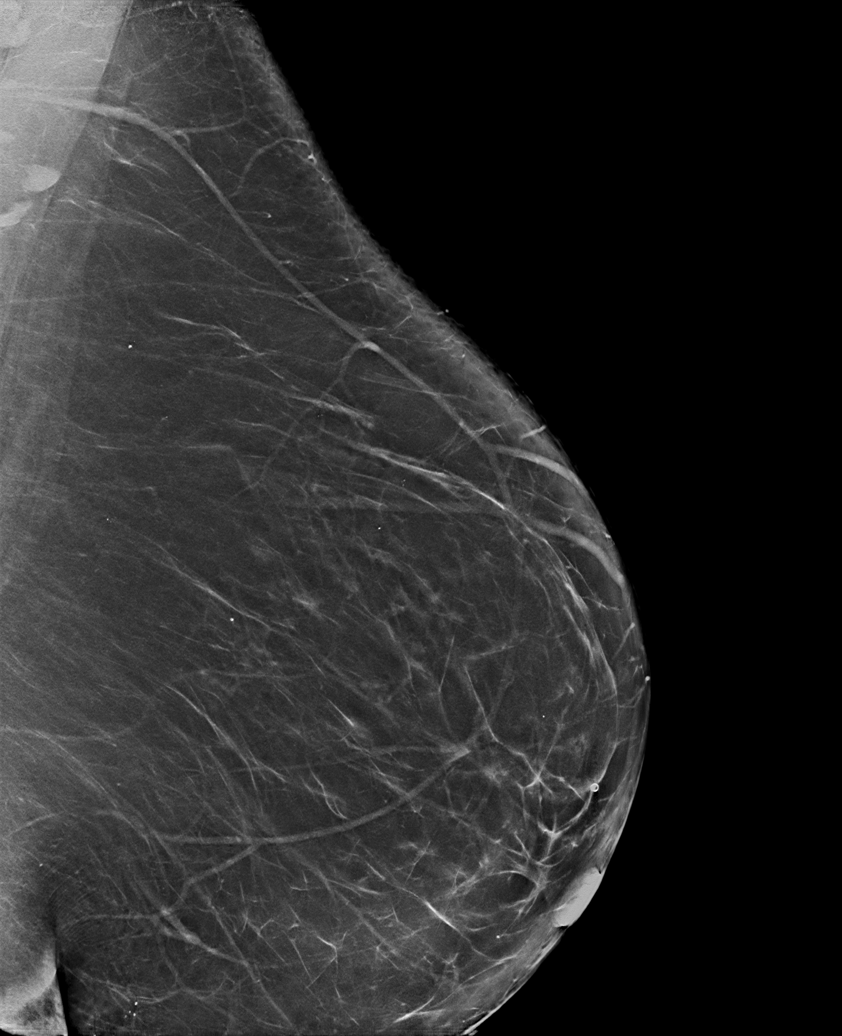

[6 of 36 positions shown; findings below may reference images not displayed]

ACR Breast Density Category b: There are scattered areas of
fibroglandular density.
FINDINGS: There are no findings suspicious for malignancy.
IMPRESSION: No mammographic evidence of malignancy. A result letter of this
screening mammogram will be mailed directly to the patient.

RECOMMENDATION:
Screening mammogram in one year. (Code:51-O-LD2)

BI-RADS CATEGORY  1: Negative.

## 2023-05-26 ENCOUNTER — Other Ambulatory Visit: Payer: Self-pay

## 2023-05-26 DIAGNOSIS — Z1231 Encounter for screening mammogram for malignant neoplasm of breast: Secondary | ICD-10-CM

## 2023-06-22 ENCOUNTER — Ambulatory Visit: Payer: BLUE CROSS/BLUE SHIELD

## 2023-07-16 ENCOUNTER — Ambulatory Visit
Admission: RE | Admit: 2023-07-16 | Discharge: 2023-07-16 | Disposition: A | Discharge status: Home / Self Care | Payer: No Typology Code available for payment source | Source: Ambulatory Visit | Attending: Family | Admitting: Family

## 2023-07-16 DIAGNOSIS — Z1231 Encounter for screening mammogram for malignant neoplasm of breast: Secondary | ICD-10-CM | POA: Diagnosis present

## 2023-08-18 NOTE — Progress Notes (Signed)
[facility] [address] [place], [place] [address]  Pulmonary Sleep Medicine   Office Visit Note  Patient Name: Brianna Mckenzie DOB: Jul 09, 1966 MRN [id number]    Chief Complaint: Obstructive Sleep Apnea visit  Brief History:  Brianna Mckenzie is seen today for an annual follow up visit for APAP@ 10-20 cmH2O. The patient has a 2 year history of sleep apnea. Patient is using PAP nightly.  The patient feels rested after sleeping with PAP.  The patient reports benefiting from PAP use. Reported sleepiness is  improved and the Epworth Sleepiness Score is 1 out of 24. The patient does not take naps. The patient complains of the following: pt is in need of a medium size seal instead of a large as patient has also lost weight. The compliance download shows 74% compliance with an average use time of 6 hours 41 minutes. The AHI is 1.0.  The patient does not complain of limb movements disrupting sleep. The patient continues to require PAP therapy in order to eliminate sleep apnea.   ROS  General: (-) fever, (-) chills, (-) night sweat Nose and Sinuses: (-) nasal stuffiness or itchiness, (-) postnasal drip, (-) nosebleeds, (-) sinus trouble. Mouth and Throat: (-) sore throat, (-) hoarseness. Neck: (-) swollen glands, (-) enlarged thyroid, (-) neck pain. Respiratory: - cough, - shortness of breath, - wheezing. Neurologic: - numbness, - tingling. Psychiatric: - anxiety, - depression   Current Medication: Outpatient Encounter Medications as of 08/19/2023  Medication Sig Note   tirzepatide (ZEPBOUND) 15 MG/0.5ML Pen Inject into the skin.    ALLEGRA-D ALLERGY & CONGESTION 180-240 MG per 24 hr tablet TAKE 1 TAB BY MOUTH ONCE A DAY 05/08/2015: Received from: External Pharmacy   aspirin EC 81 MG tablet Take by mouth. 05/08/2015: Received from: [facility]   B Complex Vitamins (VITAMIN B COMPLEX PO) Take by mouth.    butalbital-acetaminophen-caffeine (FIORICET) 50-325-40 MG  tablet Take by mouth.    butalbital-acetaminophen-caffeine (FIORICET, ESGIC) 50-325-40 MG per tablet TAKE 1 TABLET BY MOUTH EVERY 6 HOURS AS NEEDED FOR MIGRAINE 05/08/2015: Received from: External Pharmacy   Cetirizine HCl 10 MG CAPS Take by mouth.    EPINEPHrine (EPIPEN 2-PAK) 0.3 mg/0.3 mL IJ SOAJ injection Inject into the muscle once.    FLUoxetine (PROZAC) 20 MG tablet Take by mouth.    FLUoxetine (PROZAC) 40 MG capsule Take 40 mg by mouth daily. PM 05/08/2015: Received from: External Pharmacy   FLUoxetine (PROZAC) 40 MG capsule fluoxetine 40 mg capsule    fluticasone (FLONASE) 50 MCG/ACT nasal spray fluticasone propionate 50 mcg/actuation nasal spray,suspension    lisinopril (ZESTRIL) 10 MG tablet Take 10 mg by mouth daily.    ondansetron (ZOFRAN) 4 MG tablet Take 4 mg by mouth every 8 (eight) hours as needed for nausea or vomiting.    ondansetron (ZOFRAN) 4 MG tablet Take by mouth.    traZODone (DESYREL) 50 MG tablet Take 50 mg by mouth at bedtime.    [DISCONTINUED] lisinopril (ZESTRIL) 40 MG tablet Take 40 mg by mouth daily.    [DISCONTINUED] OZEMPIC, 0.25 OR 0.5 MG/DOSE, 2 MG/1.5ML SOPN Inject into the skin.    No facility-administered encounter medications on file as of 08/19/2023.    Surgical History: Past Surgical History:  Procedure Laterality Date   ABDOMINAL HYSTERECTOMY     APPENDECTOMY     CHOLECYSTECTOMY     COLONOSCOPY WITH PROPOFOL N/A 05/18/2015   Procedure: COLONOSCOPY WITH PROPOFOL;  Surgeon: Midge Minium, MD;  Location:  [facility];  Service: Endoscopy;  Laterality: N/A;   MENISCUS REPAIR Right    TENDON REPAIR Left    thumb    Medical History: Past Medical History:  Diagnosis Date   Complication of anesthesia    causes migraines and PONV   Family history of adverse reaction to anesthesia    causes migraines and PONV   GERD (gastroesophageal reflux disease)    Headache    migraines   Hypertension    Knee pain    left   PONV (postoperative nausea  and vomiting)     Family History: Non contributory to the present illness  Social History: Social History   Socioeconomic History   Marital status: Married    Spouse name: Not on file   Number of children: Not on file   Years of education: Not on file   Highest education level: Not on file  Occupational History   Not on file  Tobacco Use   Smoking status: Never   Smokeless tobacco: Never  Substance and Sexual Activity   Alcohol use: No   Drug use: Never   Sexual activity: Not on file  Other Topics Concern   Not on file  Social History Narrative   Not on file   Social Determinants of Health   Financial Resource Strain: Low Risk  (03/30/2023)   Received from [facility], [facility]   Overall Financial Resource Strain (CARDIA)    Difficulty of Paying Living Expenses: Not hard at all  Food Insecurity: No Food Insecurity (03/30/2023)   Received from [facility], [facility]   Hunger Vital Sign    Worried About Running Out of Food in the Last Year: Never true    Ran Out of Food in the Last Year: Never true  Transportation Needs: No Transportation Needs (03/30/2023)   Received from [facility], [facility]   [facility] - Transportation    In the past 12 months, has lack of transportation kept you from medical appointments or from getting medications?: No    Lack of Transportation (Non-Medical): No  Physical Activity: Not on file  Stress: Not on file  Social Connections: Not on file  Intimate Partner Violence: Not on file    Vital Signs: Blood pressure 121/85, pulse 77, resp. rate 18, height 4' 11.5" (1.511 m), weight 192 lb (87.1 kg), SpO2 98%. Body mass index is 38.13 kg/m.    Examination: General Appearance: The patient is well-developed, well-nourished, and in no distress. Neck Circumference: 42 cm Skin: Gross inspection of skin unremarkable. Head:  normocephalic, no gross deformities. Eyes: no gross deformities noted. ENT: ears appear grossly normal Neurologic: Alert and oriented. No involuntary movements.  STOP BANG RISK ASSESSMENT S (snore) Have you been told that you snore?     NO   T (tired) Are you often tired, fatigued, or sleepy during the day?   NO  O (obstruction) Do you stop breathing, choke, or gasp during sleep? NO   P (pressure) Do you have or are you being treated for high blood pressure? YES   B (BMI) Is your body index greater than 35 kg/m? YES   A (age) Are you 63 years old or older? YES   N (neck) Do you have a neck circumference greater than 16 inches?   YES   G (gender) Are you a female? NO   TOTAL STOP/BANG "YES" ANSWERS 4  A STOP-Bang score of 2 or less is considered low risk, and a score of 5 or more is high risk for having either moderate or severe OSA. For people who score 3 or 4, doctors may need to perform further assessment to determine how likely they are to have OSA.         EPWORTH SLEEPINESS SCALE:  Scale:  (0)= no chance of dozing; (1)= slight chance of dozing; (2)= moderate chance of dozing; (3)= high chance of dozing  Chance  Situtation    Sitting and reading: 0    Watching TV: 0    Sitting Inactive in public: 0    As a passenger in car: 0      Lying down to rest: 1    Sitting and talking: 0    Sitting quielty after lunch: 0    In a car, stopped in traffic: 0   TOTAL SCORE:   1 out of 24    SLEEP STUDIES:  HST (01/2021) AHI 50/hr, min SpO2 86% Titration (01/2021) APAP@ 10-20 cmH2O   CPAP COMPLIANCE DATA:  Date Range: 08/17/2022-08/16/2023  Average Daily Use: 6 hours 41 minutes  Median Use: 7 hours 13 minutes  Compliance for > 4 Hours: 74%  AHI: 1.0 respiratory events per hour  Days Used: 321/365 days  Mask Leak: 9.2  95th Percentile Pressure: 13.5         LABS: No results found for this or any previous visit (from the past 2160  hour(s)).  Radiology: MM 3D SCREENING MAMMOGRAM BILATERAL BREAST  Result Date: 07/17/2023 CLINICAL DATA:  Screening. EXAM: DIGITAL SCREENING BILATERAL MAMMOGRAM WITH TOMOSYNTHESIS AND CAD TECHNIQUE: Bilateral screening digital craniocaudal and mediolateral oblique mammograms were obtained. Bilateral screening digital breast tomosynthesis was performed. The images were evaluated with computer-aided detection. COMPARISON:  Previous exam(s). ACR Breast Density Category b: There are scattered areas of fibroglandular density. FINDINGS: There are no findings suspicious for malignancy. IMPRESSION: No mammographic evidence of malignancy. A result letter of this screening mammogram will be mailed directly to the patient. RECOMMENDATION: Screening mammogram in one year. (Code:SM-B-01Y) BI-RADS CATEGORY  1: Negative. Electronically Signed   By: Hulan Saas M.D.   On: 07/17/2023 13:20    No results found.  No results found.    Assessment and Plan: Patient Active Problem List   Diagnosis Date Noted   OSA on CPAP 08/20/2021   CPAP use counseling 08/20/2021   Morbid obesity (HCC) 08/20/2021   Special screening for malignant neoplasms, colon    First degree hemorrhoids    Calculus of kidney 05/08/2015   Gastro-esophageal reflux disease without esophagitis 04/25/2015   Essential (primary) hypertension 06/12/2011      The patient does tolerate PAP and reports benefit from PAP use. The patient was reminded how to adjust mask fit and advised to change supplies regularly. The patient was also counselled on  nightly use. The compliance is fair. The AHI is 1.0. Patient continues to require PAP to treat their apnea and is medically necessary. Patient has lost about 38lbs since last study, but is continuing to lose more and would like to hold off on retesting until she is at goal. She will continue on APAP to adjust as weight loss continues, may call if min needs to be dropped.   1. OSA on CPAP Continue  nightly use  2. CPAP use counseling CPAP couseling-Discussed importance of adequate CPAP use as well as proper care and cleaning techniques of machine and all supplies.  3.  Essential (primary) hypertension Continue current medication and f/u with PCP.  4. Obesity (BMI 30-39.9) Obesity Counseling: Had a lengthy discussion regarding patients BMI and weight issues. Patient was instructed on portion control as well as increased activity. Also discussed caloric restrictions with trying to maintain intake less than 2000 Kcal. Discussions were made in accordance with the 5As of weight management. Simple actions such as not eating late and if able to, taking a walk is suggested.    General Counseling: I have discussed the findings of the evaluation and examination with Lanora Manis.  I have also discussed any further diagnostic evaluation thatmay be needed or ordered today. Mirah verbalizes understanding of the findings of todays visit. We also reviewed her medications today and discussed drug interactions and side effects including but not limited excessive drowsiness and altered mental states. We also discussed that there is always a risk not just to her but also people around her. she has been encouraged to call the office with any questions or concerns that should arise related to todays visit.  No orders of the defined types were placed in this encounter.       I have personally obtained a history, examined the patient, evaluated laboratory and imaging results, formulated the assessment and plan and placed orders.  This patient was seen by Lynn Ito, PA-C in collaboration with Dr. Freda Munro as a part of collaborative care agreement.  Yevonne Pax, MD [facility] Diplomate ABMS Pulmonary Critical Care Medicine and Sleep Medicine

## 2023-08-19 ENCOUNTER — Ambulatory Visit (INDEPENDENT_AMBULATORY_CARE_PROVIDER_SITE_OTHER): Payer: No Typology Code available for payment source | Admitting: Internal Medicine

## 2023-08-19 VITALS — BP 121/85 | HR 77 | Resp 18 | Ht 59.5 in | Wt 192.0 lb

## 2023-08-19 DIAGNOSIS — E669 Obesity, unspecified: Secondary | ICD-10-CM | POA: Diagnosis not present

## 2023-08-19 DIAGNOSIS — I10 Essential (primary) hypertension: Secondary | ICD-10-CM

## 2023-08-19 DIAGNOSIS — G4733 Obstructive sleep apnea (adult) (pediatric): Secondary | ICD-10-CM | POA: Diagnosis not present

## 2023-08-19 DIAGNOSIS — Z7189 Other specified counseling: Secondary | ICD-10-CM

## 2023-08-19 NOTE — Patient Instructions (Signed)

## 2024-04-26 ENCOUNTER — Other Ambulatory Visit: Payer: Self-pay | Admitting: Family

## 2024-04-26 DIAGNOSIS — Z1231 Encounter for screening mammogram for malignant neoplasm of breast: Secondary | ICD-10-CM

## 2024-07-20 ENCOUNTER — Ambulatory Visit
Admission: RE | Admit: 2024-07-20 | Discharge: 2024-07-20 | Disposition: A | Discharge status: Home / Self Care | Source: Ambulatory Visit | Attending: Family | Admitting: Family

## 2024-07-20 DIAGNOSIS — Z1231 Encounter for screening mammogram for malignant neoplasm of breast: Secondary | ICD-10-CM | POA: Diagnosis present

## 2024-08-22 NOTE — Progress Notes (Signed)
 [facility] [address] [place], [place] [address]  Pulmonary Sleep Medicine   Office Visit Note  Patient Name: Brianna Mckenzie DOB: [birth year] MRN [id number]    Chief Complaint: Obstructive Sleep Apnea visit  Brief History:  Brianna Mckenzie is seen today for an annual follow up on APAP @ 10-20 cmH2O. The patient has a 3 year history of sleep apnea. Patient is using PAP nightly.  The patient feels rested after sleeping with PAP.  The patient reports benefit from PAP use. Reported sleepiness is  improved and the Epworth Sleepiness Score is 4 out of 24. The patient does not take naps. The patient complains of the following: recently changed to F40 mask and loves it.  The compliance download shows 81% compliance with an average use time of 6 hours 55 min. The AHI is 0.7  The patient does not complain of limb movements disrupting sleep.Patient reports she is on zepbound currently. Patient reports losing about 70lbs in the last 4 years.  ROS  General: (-) fever, (-) chills, (-) night sweat Nose and Sinuses: (-) nasal stuffiness or itchiness, (-) postnasal drip, (-) nosebleeds, (-) sinus trouble. Mouth and Throat: (-) sore throat, (-) hoarseness. Neck: (-) swollen glands, (-) enlarged thyroid, (-) neck pain. Respiratory: - cough, - shortness of breath, - wheezing. Neurologic: - numbness, - tingling. Psychiatric: - anxiety, - depression   Current Medication: Outpatient Encounter Medications as of 08/23/2024  Medication Sig Note   tirzepatide (ZEPBOUND) 15 MG/0.5ML Pen Inject 15 mg into the skin.    ALLEGRA-D ALLERGY & CONGESTION 180-240 MG per 24 hr tablet TAKE 1 TAB BY MOUTH ONCE A DAY 05/08/2015: Received from: External Pharmacy   aspirin EC 81 MG tablet Take by mouth. 05/08/2015: Received from: [facility]   B Complex Vitamins (VITAMIN B COMPLEX PO) Take by mouth.    butalbital-acetaminophen -caffeine (FIORICET) 50-325-40 MG tablet Take by mouth.     butalbital-acetaminophen -caffeine (FIORICET, ESGIC) 50-325-40 MG per tablet TAKE 1 TABLET BY MOUTH EVERY 6 HOURS AS NEEDED FOR MIGRAINE 05/08/2015: Received from: External Pharmacy   Cetirizine HCl 10 MG CAPS Take by mouth.    EPINEPHrine (EPIPEN 2-PAK) 0.3 mg/0.3 mL IJ SOAJ injection Inject into the muscle once.    FLUoxetine (PROZAC) 20 MG tablet Take by mouth.    FLUoxetine (PROZAC) 40 MG capsule Take 40 mg by mouth daily. PM 05/08/2015: Received from: External Pharmacy   FLUoxetine (PROZAC) 40 MG capsule fluoxetine 40 mg capsule    fluticasone (FLONASE) 50 MCG/ACT nasal spray fluticasone propionate 50 mcg/actuation nasal spray,suspension    lisinopril (ZESTRIL) 10 MG tablet Take 10 mg by mouth daily.    ondansetron  (ZOFRAN ) 4 MG tablet Take 4 mg by mouth every 8 (eight) hours as needed for nausea or vomiting.    ondansetron  (ZOFRAN ) 4 MG tablet Take by mouth.    traZODone (DESYREL) 50 MG tablet Take 50 mg by mouth at bedtime.    No facility-administered encounter medications on file as of 08/23/2024.    Surgical History: Past Surgical History:  Procedure Laterality Date   ABDOMINAL HYSTERECTOMY     APPENDECTOMY     CHOLECYSTECTOMY     COLONOSCOPY WITH PROPOFOL  N/A 05/18/2015   Procedure: COLONOSCOPY WITH PROPOFOL ;  Surgeon: Rogelia Copping, MD;  Location: [facility];  Service: Endoscopy;  Laterality: N/A;   MENISCUS REPAIR Right    TENDON REPAIR Left    thumb    Medical History: Past Medical History:  Diagnosis Date  Complication of anesthesia    causes migraines and PONV   Family history of adverse reaction to anesthesia    causes migraines and PONV   GERD (gastroesophageal reflux disease)    Headache    migraines   Hypertension    Knee pain    left   PONV (postoperative nausea and vomiting)     Family History: Non contributory to the present illness  Social History: Social History   Socioeconomic History   Marital status: Married    Spouse name: Not on file    Number of children: Not on file   Years of education: Not on file   Highest education level: Not on file  Occupational History   Not on file  Tobacco Use   Smoking status: Never   Smokeless tobacco: Never  Substance and Sexual Activity   Alcohol use: No   Drug use: Never   Sexual activity: Not on file  Other Topics Concern   Not on file  Social History Narrative   Not on file   Social Drivers of Health   Financial Resource Strain: Medium Risk (03/28/2024)   Received from [facility]   Overall Financial Resource Strain (CARDIA)    Difficulty of Paying Living Expenses: Somewhat hard  Food Insecurity: No Food Insecurity (03/28/2024)   Received from [facility]   Hunger Vital Sign    Within the past 12 months, you worried that your food would run out before you got the money to buy more.: Never true    Within the past 12 months, the food you bought just didn't last and you didn't have money to get more.: Never true  Transportation Needs: No Transportation Needs (03/28/2024)   Received from [facility] - Transportation    In the past 12 months, has lack of transportation kept you from medical appointments or from getting medications?: No    Lack of Transportation (Non-Medical): No  Physical Activity: Not on file  Stress: Not on file  Social Connections: Not on file  Intimate Partner Violence: Not on file    Vital Signs: Blood pressure 124/84, pulse 85, resp. rate 12, height 4' 11 (1.499 m), weight 170 lb (77.1 kg), SpO2 98%. Body mass index is 34.34 kg/m.    Examination: General Appearance: The patient is well-developed, well-nourished, and in no distress. Neck Circumference: 36cm Skin: Gross inspection of skin unremarkable. Head: normocephalic, no gross deformities. Eyes: no gross deformities noted. ENT: ears appear grossly normal Neurologic: Alert and oriented. No involuntary movements.  STOP BANG RISK  ASSESSMENT S (snore) Have you been told that you snore?     No   T (tired) Are you often tired, fatigued, or sleepy during the day?   NO  O (obstruction) Do you stop breathing, choke, or gasp during sleep? NO   P (pressure) Do you have or are you being treated for high blood pressure? YES   B (BMI) Is your body index greater than 35 kg/m? YES   A (age) Are you 66 years old or older? YES   N (neck) Do you have a neck circumference greater than 16 inches?   NO   G (gender) Are you a female? NO   TOTAL STOP/BANG "YES" ANSWERS 3       A STOP-Bang score of 2 or less is considered low risk, and a score of 5 or more is high risk for having either moderate or severe OSA. For  people who score 3 or 4, doctors may need to perform further assessment to determine how likely they are to have OSA.         EPWORTH SLEEPINESS SCALE:  Scale:  (0)= no chance of dozing; (1)= slight chance of dozing; (2)= moderate chance of dozing; (3)= high chance of dozing  Chance  Situtation    Sitting and reading: 0    Watching TV: 1    Sitting Inactive in public: 0    As a passenger in car: 1      Lying down to rest: 2    Sitting and talking: 0    Sitting quielty after lunch: 0    In a car, stopped in traffic: 0   TOTAL SCORE:   4 out of 24    SLEEP STUDIES:  HST (01/2021) AHI 50/hr, min Sp02 86%   CPAP COMPLIANCE DATA:  Date Range: 08/20/2023-08/18/2024  Average Daily Use: 6 hours 55 min  Median Use: 7 hrs 20 min  Compliance for > 4 Hours: 81% days  AHI: 0.7 respiratory events per hour  Days Used: 333/365  Mask Leak: 8.6  95th Percentile Pressure: 12.1 cmh20         LABS: No results found for this or any previous visit (from the past 2160 hours).  Radiology: MM 3D SCREENING MAMMOGRAM BILATERAL BREAST Result Date: 07/25/2024 CLINICAL DATA:  Screening. EXAM: DIGITAL SCREENING BILATERAL MAMMOGRAM WITH TOMOSYNTHESIS AND CAD TECHNIQUE: Bilateral screening digital  craniocaudal and mediolateral oblique mammograms were obtained. Bilateral screening digital breast tomosynthesis was performed. The images were evaluated with computer-aided detection. COMPARISON:  Previous exam(s). ACR Breast Density Category b: There are scattered areas of fibroglandular density. FINDINGS: There are no findings suspicious for malignancy. IMPRESSION: No mammographic evidence of malignancy. A result letter of this screening mammogram will be mailed directly to the patient. RECOMMENDATION: Screening mammogram in one year. (Code:SM-B-01Y) BI-RADS CATEGORY  1: Negative. Electronically Signed   By: Alm Parkins M.D.   On: 07/25/2024 09:27    No results found.  No results found.    Assessment and Plan: Patient Active Problem List   Diagnosis Date Noted   OSA on CPAP 08/20/2021   CPAP use counseling 08/20/2021   Morbid obesity (HCC) 08/20/2021   Special screening for malignant neoplasms, colon    First degree hemorrhoids    Calculus of kidney 05/08/2015   Gastro-esophageal reflux disease without esophagitis 04/25/2015   Essential (primary) hypertension 06/12/2011      The patient does tolerate PAP and reports benefit from PAP use. The patient was reminded how to adjust mask fit and advised to change supplies regularly. The patient was also counselled on nightly use. The compliance is excellent. The AHI is 0.7. Patient continues to require PAP to treat their apnea and is medically necessary. Patient has lost significant weight and is continuing to lose. She would like to hold off on new testing for now as she still feels she needs pap and is still averaging a pressure of 12cm H2O on APAP. Will reconsider testing in future when closer to weight loss goal. Advised to call if any concerns.   1. OSA on CPAP (Primary) Continue excellent compliance  2. CPAP use counseling CPAP couseling-Discussed importance of adequate CPAP use as well as proper care and cleaning techniques of  machine and all supplies.  3. Essential (primary) hypertension Continue current medication and f/u with PCP.  4. Obesity (BMI 30-39.9) Obesity Counseling: Had a lengthy discussion regarding patients BMI  and weight issues. Patient was instructed on portion control as well as increased activity. Also discussed caloric restrictions with trying to maintain intake less than 2000 Kcal. Discussions were made in accordance with the 5As of weight management. Simple actions such as not eating late and if able to, taking a walk is suggested.    General Counseling: I have discussed the findings of the evaluation and examination with Almarie.  I have also discussed any further diagnostic evaluation thatmay be needed or ordered today. Tatia verbalizes understanding of the findings of todays visit. We also reviewed her medications today and discussed drug interactions and side effects including but not limited excessive drowsiness and altered mental states. We also discussed that there is always a risk not just to her but also people around her. she has been encouraged to call the office with any questions or concerns that should arise related to todays visit.  No orders of the defined types were placed in this encounter.       I have personally obtained a history, examined the patient, evaluated laboratory and imaging results, formulated the assessment and plan and placed orders.  This patient was seen by Tinnie Pro, PA-C in collaboration with Dr. Elfreda Bathe as a part of collaborative care agreement.  Elfreda DELENA Bathe, MD [facility] Diplomate ABMS Pulmonary Critical Care Medicine and Sleep Medicine

## 2024-08-23 ENCOUNTER — Ambulatory Visit (INDEPENDENT_AMBULATORY_CARE_PROVIDER_SITE_OTHER): Admitting: Internal Medicine

## 2024-08-23 VITALS — BP 124/84 | HR 85 | Resp 12 | Ht 59.0 in | Wt 170.0 lb

## 2024-08-23 DIAGNOSIS — E669 Obesity, unspecified: Secondary | ICD-10-CM

## 2024-08-23 DIAGNOSIS — G4733 Obstructive sleep apnea (adult) (pediatric): Secondary | ICD-10-CM

## 2024-08-23 DIAGNOSIS — I10 Essential (primary) hypertension: Secondary | ICD-10-CM

## 2024-08-23 DIAGNOSIS — Z7189 Other specified counseling: Secondary | ICD-10-CM

## 2024-08-23 NOTE — Patient Instructions (Signed)
